# Patient Record
Sex: Male | Born: 1990 | Race: White | Hispanic: No | Marital: Single | Smoking: Former smoker
Health system: Southern US, Community
[De-identification: ages and names within clinical notes are randomized; demographics above are authoritative.]

## PROBLEM LIST (undated history)

## (undated) DIAGNOSIS — F329 Major depressive disorder, single episode, unspecified: Secondary | ICD-10-CM

## (undated) DIAGNOSIS — F1121 Opioid dependence, in remission: Secondary | ICD-10-CM

## (undated) DIAGNOSIS — I10 Essential (primary) hypertension: Secondary | ICD-10-CM

## (undated) DIAGNOSIS — F419 Anxiety disorder, unspecified: Secondary | ICD-10-CM

## (undated) DIAGNOSIS — R569 Unspecified convulsions: Secondary | ICD-10-CM

## (undated) DIAGNOSIS — F32A Depression, unspecified: Secondary | ICD-10-CM

## (undated) HISTORY — PX: APPENDECTOMY: SHX54

---
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
## 2010-12-31 ENCOUNTER — Encounter (HOSPITAL_COMMUNITY): Payer: Self-pay

## 2010-12-31 ENCOUNTER — Other Ambulatory Visit (HOSPITAL_COMMUNITY): Payer: Self-pay | Admitting: Internal Medicine

## 2010-12-31 ENCOUNTER — Ambulatory Visit (HOSPITAL_COMMUNITY)
Admission: RE | Admit: 2010-12-31 | Discharge: 2010-12-31 | Disposition: A | Discharge status: Home / Self Care | Payer: Self-pay | Source: Ambulatory Visit | Attending: Internal Medicine | Admitting: Internal Medicine

## 2010-12-31 DIAGNOSIS — M79641 Pain in right hand: Secondary | ICD-10-CM

## 2010-12-31 DIAGNOSIS — M79609 Pain in unspecified limb: Secondary | ICD-10-CM | POA: Insufficient documentation

## 2011-02-18 NOTE — Op Note (Signed)
Javier Johnston, Javier Johnston             ACCOUNT NO.:  [id number]   MEDICAL RECORD NO.:  [id number]          PATIENT TYPE:  INP   LOCATION:  A304                          FACILITY:  [facility]   PHYSICIAN:  Dalia Heading, M.D.  DATE OF BIRTH:  1991-04-26   DATE OF PROCEDURE:  06/24/2007  DATE OF DISCHARGE:                               OPERATIVE REPORT   PREOPERATIVE DIAGNOSIS:  Acute appendicitis.   POSTOPERATIVE DIAGNOSIS:  Acute appendicitis.   PROCEDURE:  Laparoscopic appendectomy.   SURGEON:  Dr. Franky Macho.   ANESTHESIA:  General endotracheal.   INDICATIONS:  The patient is a 20 year old white male who presents with  acute onset of right lower quadrant abdominal pain.  He was found on CT  scan of the abdomen to have acute appendicitis.  Risks and benefits of  the procedure including bleeding, infection, and the possibility of an  open procedure were fully explained to the patient's parents, gave  informed consent for the patient as the patient is a minor.   PROCEDURE NOTE:  The patient was placed in the Trendelenburg position  after the abdomen was prepped and draped in the usual sterile technique  with Betadine.  Surgical site confirmation was performed.   A supraumbilical incision was made down to the fascia.  A Veress needle  was introduced into the abdominal cavity and confirmation of placement  was done using the saline drop test.  The abdomen was then insufflated  to 16 mmHg pressure.  An 11 mm trocar was introduced into the abdominal  cavity under direct visualization without difficulty.  An additional 12-  mm trocar was placed in suprapubic region and a 5-mm trocar was placed  left lower quadrant region.  The appendix was visualized and noted to be  diffusely inflamed.  The mesoappendix was divided using the harmonic  scalpel.  Vascular Endo-GIA was placed across the base of the appendix  and fired.  The appendix was removed using EndoCatch bag.  The staple  line was  inspected and noted to be within normal limits.  All fluid and  then evacuate from the abdominal cavity prior to removal of the trocars.   All wounds were irrigated normal saline.  All wounds were injected with  0.5 cm Sensorcaine.  The supraumbilical fascia as well as suprapubic  fascia reapproximated using 0 Vicryl interrupted sutures.  All skin  incisions were closed using staples.  Betadine ointment, dry sterile  dressings were applied.   All tape and needle counts correct the end of the procedure.  The  patient was extubated in the operating room and went back to recovery  room awake in stable condition.   COMPLICATIONS:  None.   SPECIMEN:  Appendix.   BLOOD LOSS:  Minimal.      Dalia Heading, M.D.  Electronically Signed     MAJ/MEDQ  D:  06/24/2007  T:  06/25/2007  Job:  [id number]

## 2011-07-02 ENCOUNTER — Emergency Department (HOSPITAL_COMMUNITY): Payer: Self-pay

## 2011-07-02 ENCOUNTER — Encounter (HOSPITAL_COMMUNITY): Payer: Self-pay | Admitting: Emergency Medicine

## 2011-07-02 ENCOUNTER — Emergency Department (HOSPITAL_COMMUNITY)
Admission: EM | Admit: 2011-07-02 | Discharge: 2011-07-02 | Disposition: A | Discharge status: Home / Self Care | Payer: Self-pay | Attending: Emergency Medicine | Admitting: Emergency Medicine

## 2011-07-02 DIAGNOSIS — M7989 Other specified soft tissue disorders: Secondary | ICD-10-CM | POA: Insufficient documentation

## 2011-07-02 DIAGNOSIS — F17200 Nicotine dependence, unspecified, uncomplicated: Secondary | ICD-10-CM | POA: Insufficient documentation

## 2011-07-02 DIAGNOSIS — X58XXXA Exposure to other specified factors, initial encounter: Secondary | ICD-10-CM | POA: Insufficient documentation

## 2011-07-02 DIAGNOSIS — IMO0002 Reserved for concepts with insufficient information to code with codable children: Secondary | ICD-10-CM | POA: Insufficient documentation

## 2011-07-02 DIAGNOSIS — S60229A Contusion of unspecified hand, initial encounter: Secondary | ICD-10-CM

## 2011-07-02 DIAGNOSIS — Z880 Allergy status to penicillin: Secondary | ICD-10-CM | POA: Insufficient documentation

## 2011-07-02 MED ORDER — HYDROCODONE-ACETAMINOPHEN 5-500 MG PO TABS: 1.0000 | ORAL_TABLET | Freq: Four times a day (QID) | ORAL | Status: AC | PRN

## 2011-07-02 NOTE — ED Notes (Signed)
Pt c/o bilateral hand pain after hurting his hands while working on a truck yesterday. Pt also c/o anxiety and depression over recent loss of great grandmother/grandmother and friend and mother was recently dx with breast cancer. Pt c/o trouble sleeping. Denies si/hi.
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
## 2011-07-17 LAB — LIPASE, BLOOD: Lipase: 12

## 2011-07-17 LAB — URINALYSIS, ROUTINE W REFLEX MICROSCOPIC
Ketones, ur: NEGATIVE
Leukocytes, UA: NEGATIVE
Nitrite: NEGATIVE
Protein, ur: NEGATIVE
Urobilinogen, UA: 0.2

## 2011-07-17 LAB — DIFFERENTIAL
Eosinophils Relative: 0
Lymphocytes Relative: 8 — ABNORMAL LOW
Lymphs Abs: 1.3
Neutro Abs: 14.1 — ABNORMAL HIGH

## 2011-07-17 LAB — COMPREHENSIVE METABOLIC PANEL
AST: 35
Albumin: 4.6
CO2: 25
Calcium: 9.2
Creatinine, Ser: 0.78

## 2011-07-17 LAB — CBC
MCHC: 34.5
MCV: 91.1
Platelets: 226

## 2011-09-19 ENCOUNTER — Emergency Department (HOSPITAL_COMMUNITY)
Admission: EM | Admit: 2011-09-19 | Discharge: 2011-09-19 | Disposition: A | Discharge status: Other Acute Inpatient Hospital | Payer: Self-pay | Attending: Emergency Medicine | Admitting: Emergency Medicine

## 2011-09-19 ENCOUNTER — Encounter (HOSPITAL_COMMUNITY): Payer: Self-pay

## 2011-09-19 DIAGNOSIS — R45851 Suicidal ideations: Secondary | ICD-10-CM | POA: Insufficient documentation

## 2011-09-19 DIAGNOSIS — F3289 Other specified depressive episodes: Secondary | ICD-10-CM | POA: Insufficient documentation

## 2011-09-19 DIAGNOSIS — F329 Major depressive disorder, single episode, unspecified: Secondary | ICD-10-CM | POA: Insufficient documentation

## 2011-09-19 HISTORY — DX: Anxiety disorder, unspecified: F41.9

## 2011-09-19 HISTORY — DX: Major depressive disorder, single episode, unspecified: F32.9

## 2011-09-19 HISTORY — DX: Depression, unspecified: F32.A

## 2011-09-19 LAB — BASIC METABOLIC PANEL WITH GFR
BUN: 9 mg/dL (ref 6–23)
CO2: 30 meq/L (ref 19–32)
Calcium: 9.8 mg/dL (ref 8.4–10.5)
Chloride: 101 meq/L (ref 96–112)
Creatinine, Ser: 0.84 mg/dL (ref 0.50–1.35)
GFR calc Af Amer: >90 mL/min
GFR calc non Af Amer: >90 mL/min
Glucose, Bld: 114 mg/dL — ABNORMAL HIGH (ref 70–99)
Potassium: 3.8 meq/L (ref 3.5–5.1)
Sodium: 138 meq/L (ref 135–145)

## 2011-09-19 LAB — SALICYLATE LEVEL: Salicylate Lvl: <2 mg/dL — ABNORMAL LOW (ref 2.8–20.0)

## 2011-09-19 LAB — DIFFERENTIAL
Basophils Relative: 0 % (ref 0–1)
Lymphocytes Relative: 14 % (ref 12–46)
Lymphs Abs: 1.6 10*3/uL (ref 0.7–4.0)
Monocytes Absolute: 1.1 10*3/uL — ABNORMAL HIGH (ref 0.1–1.0)
Monocytes Relative: 10 % (ref 3–12)
Neutro Abs: 8.2 10*3/uL — ABNORMAL HIGH (ref 1.7–7.7)

## 2011-09-19 LAB — CBC
HCT: 43.1 % (ref 39.0–52.0)
Hemoglobin: 14.6 g/dL (ref 13.0–17.0)
MCH: 31.7 pg (ref 26.0–34.0)
MCHC: 33.9 g/dL (ref 30.0–36.0)
MCV: 93.7 fL (ref 78.0–100.0)
Platelets: 219 K/uL (ref 150–400)
RBC: 4.6 MIL/uL (ref 4.22–5.81)
RDW: 12.4 % (ref 11.5–15.5)
WBC: 11.5 K/uL — ABNORMAL HIGH (ref 4.0–10.5)

## 2011-09-19 LAB — RAPID URINE DRUG SCREEN, HOSP PERFORMED
Amphetamines: NOT DETECTED
Barbiturates: NOT DETECTED
Benzodiazepines: POSITIVE — AB
Cocaine: NOT DETECTED
Opiates: POSITIVE — AB
Tetrahydrocannabinol: POSITIVE — AB

## 2011-09-19 LAB — ACETAMINOPHEN LEVEL: Acetaminophen (Tylenol), Serum: <15 ug/mL (ref 10–30)

## 2011-09-19 LAB — ETHANOL: Alcohol, Ethyl (B): <11 mg/dL (ref 0–11)

## 2011-09-19 MED ORDER — NICOTINE 14 MG/24HR TD PT24: 14.0000 mg | MEDICATED_PATCH | Freq: Once | TRANSDERMAL | Status: DC

## 2011-09-19 MED ORDER — IBUPROFEN 400 MG PO TABS: 600.0000 mg | ORAL_TABLET | Freq: Three times a day (TID) | ORAL | Status: DC | PRN

## 2011-09-19 MED ORDER — NICOTINE 21 MG/24HR TD PT24: 21.0000 mg | MEDICATED_PATCH | Freq: Every day | TRANSDERMAL | Status: DC

## 2011-09-19 MED ORDER — LORAZEPAM 1 MG PO TABS: 1.0000 mg | ORAL_TABLET | Freq: Three times a day (TID) | ORAL | Status: DC | PRN

## 2011-09-19 MED ORDER — NICOTINE 21 MG/24HR TD PT24
21.0000 mg | MEDICATED_PATCH | Freq: Once | TRANSDERMAL | Status: DC
  Administered 2011-09-19: 21 mg via TRANSDERMAL

## 2011-09-19 MED ORDER — NICOTINE 21 MG/24HR TD PT24
MEDICATED_PATCH | TRANSDERMAL | Status: AC
  Filled 2011-09-19: qty 1

## 2011-09-19 MED ORDER — IBUPROFEN 800 MG PO TABS
800.0000 mg | ORAL_TABLET | Freq: Once | ORAL | Status: AC
  Administered 2011-09-19: 800 mg via ORAL
  Filled 2011-09-19: qty 1

## 2011-09-19 MED ORDER — ZOLPIDEM TARTRATE 5 MG PO TABS: 5.0000 mg | ORAL_TABLET | Freq: Every evening | ORAL | Status: DC | PRN

## 2011-09-19 MED ORDER — LORAZEPAM 1 MG PO TABS
1.0000 mg | ORAL_TABLET | Freq: Once | ORAL | Status: AC
  Administered 2011-09-19: 1 mg via ORAL
  Filled 2011-09-19: qty 1

## 2011-09-19 MED ORDER — ACETAMINOPHEN 325 MG PO TABS: 650.0000 mg | ORAL_TABLET | ORAL | Status: DC | PRN
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
## 2011-09-19 NOTE — ED Notes (Signed)
Meal tray given.  Pt requesting something for hand pain.  edp notified and orders received.

## 2011-09-19 NOTE — ED Notes (Signed)
Parents asked to leave due to increased agitation of patient.  Parents escorted out.

## 2011-09-19 NOTE — ED Notes (Signed)
Pt getting agitated again while talking to family.  RN asked family to wait in waiting room.  Family left pt's room, but have not left dept, walking around in dept.  Security called to escort family to waiting room.  Pt's grandfather did verbalize to RN that pt could live with him and pt would be safe.  Pt agreed.  Informed family that decision would be made after [facility] team eval.  Pt and family verbalized understanding.  Sitter at bedside at this time.  nad noted.

## 2011-09-19 NOTE — ED Notes (Signed)
Pt unable to void at this time.  Given cup of ice water.

## 2011-09-19 NOTE — ED Notes (Signed)
Pt brought in by [employer] for IVC. Pt with IVC papers that father took out saying pt was kneeling at fathers bed with pills spread out around him. Pt denies hallucinations. Pt denies SI/HI.

## 2011-09-19 NOTE — ED Provider Notes (Signed)
Scribed for Donnetta Hutching, MD, the patient was seen in room [facility] . This chart was scribed by Ellie Lunch.   CSN: [id number] Arrival date & time: 09/19/2011  2:01 PM   First MD Initiated Contact with Patient 09/19/11 1510      Chief Complaint  Patient presents with  . Medical Clearance    (Consider location/radiation/quality/duration/timing/severity/associated sxs/prior treatment) HPI Javier Johnston is a 20 y.o. male w h/o depression and anxiety who presents to the Emergency Department for medical clearance. Per parents For the past 6 months, Pt has struggled with pain medication abuse and illegal drug usage. Pt has been in counseling for the past 3 months with pastor. Parents reports some improvement in the past month, but in the past week Pt has seemed to relapsed. Last night Father found Pt this morning in his Parents bed with his father's pain medication around him. Pt was disoriented and diaphoretic. Also reports Pt had collected guns around the house. Pt has expressed thoughts of self harm.   Pt's father has filled IVC papers.   Past Medical History  Diagnosis Date  . Anxiety   . Depression     Past Surgical History  Procedure Date  . Appendectomy     No family history on file. Pt lives with Parents  History  Substance Use Topics  . Smoking status: Current Everyday Smoker -- 0.5 packs/day  . Smokeless tobacco: Not on file  . Alcohol Use: No      Review of Systems 10 Systems reviewed and are negative for acute change except as noted in the HPI.   Allergies  Penicillins  Home Medications   Current Outpatient Rx  Name Route Sig Dispense Refill  . ASPIRIN-ACETAMINOPHEN-CAFFEINE 250-250-65 MG PO TABS Oral Take 1 tablet by mouth every 6 (six) hours as needed. migrains       BP 143/106  Pulse 114  Temp(Src) 98.1 F (36.7 C) (Oral)  Resp 20  SpO2 96%  Physical Exam  Nursing note and vitals reviewed. Constitutional: He is oriented to person, place,  and time. He appears well-developed and well-nourished.  HENT:  Head: Normocephalic and atraumatic.  Eyes: Conjunctivae and EOM are normal. Pupils are equal, round, and reactive to light.  Neck: Normal range of motion. Neck supple.  Cardiovascular: Normal rate and regular rhythm.   Pulmonary/Chest: Effort normal and breath sounds normal.  Abdominal: Soft. Bowel sounds are normal.  Musculoskeletal: Normal range of motion.  Neurological: He is alert and oriented to person, place, and time.  Skin: Skin is warm and dry.  Psychiatric:       Flight of ideas. Tangential thinking.     ED Course  Procedures (including critical care time) DIAGNOSTIC STUDIES: Oxygen Saturation is 96% on room air, normal by my interpretation.    COORDINATION OF CARE:   Labs Reviewed  CBC - Abnormal; Notable for the following:    WBC 11.5 (*)    All other components within normal limits  DIFFERENTIAL - Abnormal; Notable for the following:    Neutro Abs 8.2 (*)    Monocytes Absolute 1.1 (*)    All other components within normal limits  BASIC METABOLIC PANEL - Abnormal; Notable for the following:    Glucose, Bld 114 (*)    All other components within normal limits  URINE RAPID DRUG SCREEN (HOSP PERFORMED) - Abnormal; Notable for the following:    Opiates POSITIVE (*)    Benzodiazepines POSITIVE (*)    Tetrahydrocannabinol POSITIVE (*)  All other components within normal limits  SALICYLATE LEVEL - Abnormal; Notable for the following:    Salicylate Lvl <2.0 (*)    All other components within normal limits  ACETAMINOPHEN LEVEL   ED MEDICATIONS  Medications  nicotine (NICODERM CQ - dosed in mg/24 hours) patch 21 mg (21 mg Transdermal Patch Applied 09/19/11 1631)  LORazepam (ATIVAN) tablet 1 mg (1 mg Oral Given 09/19/11 1631)     No diagnosis found.    MDM  Patient has problem with opiates. Also expressed suicidal ideation. Father took out commitment papers. We'll consult behavioral health  I  personally performed the services described in this documentation, which was scribed in my presence. The recorded information has been reviewed and considered.      Donnetta Hutching, MD 09/19/11 [phone]

## 2011-09-19 NOTE — ED Notes (Signed)
Father returned to room with food for patient.  Father informed by nurse and  PD not to come back tonight.

## 2011-09-19 NOTE — ED Notes (Signed)
2 diamond looking earrings, 4 valves (earrings) placed in pt belongings bag with clothing, boots, and cell phone - locked in cabinet in EMS room.  Wallet with approx $100 given to security to lock in safe.

## 2011-09-19 NOTE — ED Notes (Signed)
Patient has been accepted to [facility].  Sheriff notified of need for transport for IVC.

## 2011-09-19 NOTE — ED Notes (Signed)
Patient left in custody of [facility].

## 2011-09-19 NOTE — ED Notes (Signed)
Patient requested that his wallet and cell phone be released to his parents.  Security notified and items released to mother.

## 2011-09-19 NOTE — ED Notes (Signed)
Pt's parents at bedside.  [employer] signed off on observation form.  Pt calm, cooperative.  nad noted.

## 2011-09-19 NOTE — ED Notes (Signed)
Felesa from [facility] in to speak with patient regarding need to be transferred to inpatient facility.

## 2011-09-19 NOTE — ED Notes (Signed)
Pt up to bathroom with sitter.  nad noted. 

## 2011-09-19 NOTE — ED Notes (Signed)
Pt became agitated, tearful stating "my nerves are getting to me."  Pt upset stating he does not need to go to rehab.  Process explained to pt.  Pt arguing with father.  Informed pt and family not to discuss current situation to prevent pt from getting upset.  Informed family that if pt continued to be upset that they would be asked to wait in waiting room.  Pt and family verbalized understanding.  Pt requesting nicotine patch at this time.  edp notified.

## 2011-11-02 ENCOUNTER — Other Ambulatory Visit: Payer: Self-pay

## 2011-11-02 ENCOUNTER — Emergency Department (HOSPITAL_COMMUNITY)
Admission: EM | Admit: 2011-11-02 | Discharge: 2011-11-02 | Disposition: A | Discharge status: Home / Self Care | Payer: Self-pay | Attending: Emergency Medicine | Admitting: Emergency Medicine

## 2011-11-02 ENCOUNTER — Encounter (HOSPITAL_COMMUNITY): Payer: Self-pay

## 2011-11-02 DIAGNOSIS — F411 Generalized anxiety disorder: Secondary | ICD-10-CM | POA: Insufficient documentation

## 2011-11-02 DIAGNOSIS — R569 Unspecified convulsions: Secondary | ICD-10-CM | POA: Insufficient documentation

## 2011-11-02 DIAGNOSIS — Z7982 Long term (current) use of aspirin: Secondary | ICD-10-CM | POA: Insufficient documentation

## 2011-11-02 DIAGNOSIS — Z9889 Other specified postprocedural states: Secondary | ICD-10-CM | POA: Insufficient documentation

## 2011-11-02 DIAGNOSIS — F329 Major depressive disorder, single episode, unspecified: Secondary | ICD-10-CM | POA: Insufficient documentation

## 2011-11-02 DIAGNOSIS — F3289 Other specified depressive episodes: Secondary | ICD-10-CM | POA: Insufficient documentation

## 2011-11-02 DIAGNOSIS — F17200 Nicotine dependence, unspecified, uncomplicated: Secondary | ICD-10-CM | POA: Insufficient documentation

## 2011-11-02 DIAGNOSIS — F1210 Cannabis abuse, uncomplicated: Secondary | ICD-10-CM | POA: Insufficient documentation

## 2011-11-02 HISTORY — DX: Unspecified convulsions: R56.9

## 2011-11-02 LAB — DIFFERENTIAL
Basophils Relative: 0 % (ref 0–1)
Eosinophils Absolute: 0.1 10*3/uL (ref 0.0–0.7)
Eosinophils Relative: 1 % (ref 0–5)
Lymphocytes Relative: 8 % — ABNORMAL LOW (ref 12–46)
Lymphs Abs: 1.1 10*3/uL (ref 0.7–4.0)
Neutrophils Relative %: 86 % — ABNORMAL HIGH (ref 43–77)

## 2011-11-02 LAB — CBC
MCV: 92.9 fL (ref 78.0–100.0)
Platelets: 244 10*3/uL (ref 150–400)
RBC: 5.08 MIL/uL (ref 4.22–5.81)
RDW: 13 % (ref 11.5–15.5)
WBC: 14.6 10*3/uL — ABNORMAL HIGH (ref 4.0–10.5)

## 2011-11-02 LAB — RAPID URINE DRUG SCREEN, HOSP PERFORMED
Amphetamines: NOT DETECTED
Barbiturates: NOT DETECTED
Benzodiazepines: NOT DETECTED
Tetrahydrocannabinol: POSITIVE — AB

## 2011-11-02 LAB — BASIC METABOLIC PANEL
BUN: 6 mg/dL (ref 6–23)
Chloride: 100 mEq/L (ref 96–112)
Creatinine, Ser: 0.84 mg/dL (ref 0.50–1.35)
GFR calc Af Amer: >90 mL/min (ref >90)
Glucose, Bld: 94 mg/dL (ref 70–99)
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
## 2011-11-02 NOTE — ED Notes (Signed)
Mom states, " his pastor wants [place] to tell you not to give pump any more medication until we find out what is going on" pt reports being sent to [facility] in December for narcotic addiction. Pt recently started on Wellbutrin for depression. Pt reports no change in depressed state since starting Wellbutrin. Denies si/hi

## 2011-11-02 NOTE — ED Notes (Signed)
Reports received from [facility] and given to MD.

## 2011-11-02 NOTE — ED Provider Notes (Addendum)
History   This chart was scribed for Gerhard Munch, MD by Clarita Crane. The patient was seen in room APA01/APA01 and the patient's care was started at 12:17PM.   CSN: [id number]  Arrival date & time 11/02/11  1148   First MD Initiated Contact with Patient 11/02/11 1206      Chief Complaint  Patient presents with  . Seizures    (Consider location/radiation/quality/duration/timing/severity/associated sxs/prior treatment) HPI Javier Johnston is a 21 y.o. male who presents to the Emergency Department accompanied by family members to be evaluated following seizure which occurred this morning while in church. Patient's family members note patient contracted his upper extremities and fell backwards from a seated positon at onset of seizure. Family members note patient returned to baseline orientation approximately 10 minutes following termination of seizure activity. Patient reports he experienced nausea, vomiting and HA following termination of seizure. Denies neck pain, visual disturbances. Furthermore, patient indicates that he has been fatigued and unable to sleep well for the past month. Patient with h/o seizure with last one occuring 1 month ago. Patient notes having evaluation performed for seizures at that time and had an MRI performed.  PCP- Renard Matter  Past Medical History  Diagnosis Date  . Anxiety   . Depression   . Seizure     had 1 seizure approx 2 months ago.    Past Surgical History  Procedure Date  . Appendectomy     No family history on file.  History  Substance Use Topics  . Smoking status: Current Everyday Smoker -- 0.5 packs/day  . Smokeless tobacco: Not on file  . Alcohol Use: No      Review of Systems 10 Systems reviewed and are negative for acute change except as noted in the HPI.  Allergies  Penicillins  Home Medications   Current Outpatient Rx  Name Route Sig Dispense Refill  . ASPIRIN-ACETAMINOPHEN-CAFFEINE 250-250-65 MG PO TABS Oral Take 1  tablet by mouth every 6 (six) hours as needed. migraines    . BUPROPION HCL ER (XL) 150 MG PO TB24 Oral Take 150 mg by mouth daily.    Marland Kitchen GABAPENTIN 300 MG PO CAPS Oral Take 300 mg by mouth 3 (three) times daily.      BP 138/88  Pulse 105  Temp(Src) 97.7 F (36.5 C) (Oral)  Resp 14  Ht 5\' 6"  (1.676 m)  Wt 140 lb (63.504 kg)  BMI 22.60 kg/m2  SpO2 97%  Physical Exam  Nursing note and vitals reviewed. Constitutional: He is oriented to person, place, and time. He appears well-developed and well-nourished. No distress.  HENT:  Head: Normocephalic and atraumatic.  Eyes: EOM are normal. Pupils are equal, round, and reactive to light.       Fundoscopic exam normal.   Neck: Normal range of motion. Neck supple. No tracheal deviation present.  Cardiovascular: Normal rate and regular rhythm.  Exam reveals no gallop and no friction rub.   No murmur heard. Pulmonary/Chest: Effort normal. No respiratory distress. He has no wheezes. He has no rales.  Abdominal: Soft. He exhibits no distension.  Musculoskeletal: Normal range of motion. He exhibits no edema.       C-spine non-tender.   Neurological: He is alert and oriented to person, place, and time. No sensory deficit.  Skin: Skin is warm and dry.  Psychiatric: He has a normal mood and affect. His behavior is normal.    ED Course  Procedures (including critical care time)  DIAGNOSTIC STUDIES: Oxygen Saturation is 96%  on room air, normal by my interpretation.    COORDINATION OF CARE: 12:20PM- Patient and family members informed of current plan for treatment and evaluation and agree with plan. 3:03PM- Patient informed of current lab and imaging results. Informed of intent to d/c home. Patient agrees with plan set forth at this time.  Labs Reviewed  URINE RAPID DRUG SCREEN (HOSP PERFORMED) - Abnormal; Notable for the following:    Tetrahydrocannabinol POSITIVE (*)    All other components within normal limits  CBC - Abnormal; Notable for  the following:    WBC 14.6 (*)    All other components within normal limits  DIFFERENTIAL - Abnormal; Notable for the following:    Neutrophils Relative 86 (*)    Neutro Abs 12.5 (*)    Lymphocytes Relative 8 (*)    All other components within normal limits  BASIC METABOLIC PANEL - Abnormal; Notable for the following:    Potassium 3.3 (*)    All other components within normal limits  GLUCOSE, CAPILLARY  POCT CBG MONITORING   No results found.   No diagnosis found.   Date: 11/02/2011  Rate: 93  Rhythm: normal sinus rhythm  QRS Axis: normal  Intervals: normal  ST/T Wave abnormalities: normal  Conduction Disutrbances:Rsr'  Narrative Interpretation:   Old EKG Reviewed: none available Abnormal ecg   MDM  This young male presents following a witnessed seizure.  The patient has history of one similar event, one month ago.  Following that event she was evaluated with CT scan, no neurology followup.  Today following a postictal period the patient is back to his baseline, according to him and his family.  Patient's labs are consistent with prior seizure.  Outside hospital records were obtained, and reviewed.  Notable positive findings include a toxicology screen that was positive for barbiturates, benzodiazepines, opiates, THC.  Given the use of these substances, and the patient's new use of Wellbutrin, there are multiple prior pharmacologic possibilities for the patient's new lower seizure threshold.  A prolonged discussion was conducted patient regarding necessity to cease all illicit medications as well as stop Wellbutrin pending review by his primary care physician.  The patient was also advised to continue not driving, and he was provided followup referral to neurology.  I personally performed the services described in this documentation, which was scribed in my presence. The recorded information has been reviewed and considered.    Gerhard Munch, MD 11/02/11 1610  Gerhard Munch, MD 11/02/11 [phone]

## 2011-11-02 NOTE — ED Notes (Signed)
Consent obtained for medical information release from [facility] in [place] where last treated for last seizure

## 2011-12-09 ENCOUNTER — Emergency Department (HOSPITAL_COMMUNITY)
Admission: EM | Admit: 2011-12-09 | Discharge: 2011-12-09 | Disposition: A | Discharge status: Home Health | Payer: Self-pay | Attending: Emergency Medicine | Admitting: Emergency Medicine

## 2011-12-09 ENCOUNTER — Emergency Department (HOSPITAL_COMMUNITY): Payer: Self-pay

## 2011-12-09 ENCOUNTER — Encounter (HOSPITAL_COMMUNITY): Payer: Self-pay | Admitting: *Deleted

## 2011-12-09 DIAGNOSIS — M25449 Effusion, unspecified hand: Secondary | ICD-10-CM | POA: Insufficient documentation

## 2011-12-09 DIAGNOSIS — M79609 Pain in unspecified limb: Secondary | ICD-10-CM | POA: Insufficient documentation

## 2011-12-09 DIAGNOSIS — F341 Dysthymic disorder: Secondary | ICD-10-CM | POA: Insufficient documentation

## 2011-12-09 DIAGNOSIS — IMO0002 Reserved for concepts with insufficient information to code with codable children: Secondary | ICD-10-CM | POA: Insufficient documentation

## 2011-12-09 DIAGNOSIS — S60229A Contusion of unspecified hand, initial encounter: Secondary | ICD-10-CM | POA: Insufficient documentation

## 2011-12-09 DIAGNOSIS — W208XXA Other cause of strike by thrown, projected or falling object, initial encounter: Secondary | ICD-10-CM | POA: Insufficient documentation

## 2011-12-09 DIAGNOSIS — I10 Essential (primary) hypertension: Secondary | ICD-10-CM | POA: Insufficient documentation

## 2011-12-09 DIAGNOSIS — M7989 Other specified soft tissue disorders: Secondary | ICD-10-CM | POA: Insufficient documentation

## 2011-12-09 DIAGNOSIS — F17200 Nicotine dependence, unspecified, uncomplicated: Secondary | ICD-10-CM | POA: Insufficient documentation

## 2011-12-09 HISTORY — DX: Essential (primary) hypertension: I10

## 2011-12-09 MED ORDER — IBUPROFEN 800 MG PO TABS: 800.0000 mg | ORAL_TABLET | Freq: Three times a day (TID) | ORAL | Status: AC

## 2011-12-09 MED ORDER — HYDROCODONE-ACETAMINOPHEN 5-325 MG PO TABS
1.0000 | ORAL_TABLET | Freq: Once | ORAL | Status: AC
  Administered 2011-12-09: 1 via ORAL
  Filled 2011-12-09: qty 1

## 2011-12-09 MED ORDER — HYDROCODONE-ACETAMINOPHEN 5-325 MG PO TABS: ORAL_TABLET | ORAL | Status: AC

## 2011-12-09 NOTE — Discharge Instructions (Signed)
Contusion  A contusion is a deep bruise. Contusions happen when an injury causes bleeding under the skin. Signs of bruising include pain, puffiness (swelling), and discolored skin. The contusion may turn blue, purple, or yellow.  HOME CARE    Put ice on the injured area.   Put ice in a plastic bag.   Place a towel between your skin and the bag.   Leave the ice on for 15 to 20 minutes, 3 to 4 times a day.   Only take medicine as told by your doctor.   Rest the injured area.   If possible, raise (elevate) the injured area to lessen puffiness.  GET HELP RIGHT AWAY IF:    You have more bruising or puffiness.   You have pain that is getting worse.   Your puffiness or pain is not helped by medicine.  MAKE SURE YOU:    Understand these instructions.   Will watch your condition.   Will get help right away if you are not doing well or get worse.  Document Released: 03/10/2008 Document Revised: 09/11/2011 Document Reviewed: 07/28/2011  [employer] Patient Information 2012 [employer].

## 2011-12-09 NOTE — ED Provider Notes (Signed)
History     CSN: [id number]  Arrival date & time 12/09/11  1051   First MD Initiated Contact with Patient 12/09/11 1101      Chief Complaint  Patient presents with  . Hand Injury    (Consider location/radiation/quality/duration/timing/severity/associated sxs/prior treatment) HPI Comments: Patient complains of pain to his right hand after a piece of wood fell on his hand. He reports pain to his fourth and fifth metacarpals. He also states he's had a previous injury to the same area. Pain is worse with palpation or movement. He states pain radiates into his wrist. He denies other injuries, numbness or weakness. Patient is right-hand dominant  Patient is a 21 y.o. male presenting with hand injury. The history is provided by the patient. No language interpreter was used.  Hand Injury  Incident onset: Several hours ago. The incident occurred at home. The injury mechanism was a direct blow. The pain is present in the right hand. The quality of the pain is described as aching and throbbing. The pain is moderate. The pain has been constant since the incident. Pertinent negatives include no fever and no malaise/fatigue. He reports no foreign bodies present. The symptoms are aggravated by movement, use and palpation. He has tried ice for the symptoms. The treatment provided mild relief.    Past Medical History  Diagnosis Date  . Anxiety   . Depression   . Seizure     had 1 seizure approx 2 months ago.  Marland Kitchen Hypertension     Past Surgical History  Procedure Date  . Appendectomy     History reviewed. No pertinent family history.  History  Substance Use Topics  . Smoking status: Current Everyday Smoker -- 0.5 packs/day  . Smokeless tobacco: Not on file  . Alcohol Use: No      Review of Systems  Constitutional: Negative for fever, chills and malaise/fatigue.  Gastrointestinal: Negative for nausea.  Genitourinary: Negative for dysuria, hematuria and flank pain.  Musculoskeletal:  Positive for joint swelling and arthralgias. Negative for myalgias and back pain.  Skin: Negative for rash.       Abrasion  Neurological: Negative for dizziness, weakness and numbness.  Hematological: Does not bruise/bleed easily.  All other systems reviewed and are negative.    Allergies  Penicillins  Home Medications   Current Outpatient Rx  Name Route Sig Dispense Refill  . AMLODIPINE BESYLATE 5 MG PO TABS Oral Take 5 mg by mouth daily.    . BUSPIRONE HCL 10 MG PO TABS Oral Take 10 mg by mouth 2 (two) times daily.    Marland Kitchen GABAPENTIN 300 MG PO CAPS Oral Take 300 mg by mouth 3 (three) times daily.    Jeananne Rama SULFATE 0.05-0.25 % OP SOLN Both Eyes Place 2 drops into both eyes 3 (three) times daily as needed. Running Eyes      BP 143/79  Pulse 117  Temp(Src) 98.6 F (37 C) (Oral)  Resp 20  Ht 5\' 6"  (1.676 m)  Wt 160 lb (72.576 kg)  BMI 25.82 kg/m2  SpO2 100%  Physical Exam  Nursing note and vitals reviewed. Constitutional: He is oriented to person, place, and time. He appears well-developed and well-nourished. No distress.  HENT:  Head: Normocephalic and atraumatic.  Cardiovascular: Normal rate, regular rhythm and normal heart sounds.   Pulmonary/Chest: Effort normal and breath sounds normal. No respiratory distress.  Musculoskeletal: Normal range of motion. He exhibits edema and tenderness.       Right hand: He exhibits  tenderness, bony tenderness and swelling. He exhibits normal range of motion, normal two-point discrimination, normal capillary refill and no deformity. normal sensation noted. Normal strength noted.       Hands:      Tenderness to palpation of the right fourth and fifth metacarpals. Mild to moderate soft tissue swelling. Small abrasion also present to the area. Radial pulse is strong, distal sensation is intact. Capillary refill is less than 2 seconds.  Neurological: He is alert and oriented to person, place, and time. He exhibits normal muscle  tone. Coordination normal.  Skin: Skin is warm and dry.    ED Course  Procedures (including critical care time)  Labs Reviewed - No data to display Dg Hand Complete Right  12/09/2011  *RADIOLOGY REPORT*  Clinical Data: 21 year old male with pain and swelling of the fourth MCP following injury.  RIGHT HAND - COMPLETE 3+ VIEW  Comparison: 07/02/2011.  Findings: Bone mineralization is within normal limits.  Distal radius and ulna appear intact.  Carpal bone alignment within normal limits.  Metacarpals appear intact.  MCP joints are within normal limits.  There is dorsal soft tissue swelling noted. No radiopaque foreign body identified.  No subcutaneous gas.  No fracture or bony erosion identified.  IMPRESSION: Soft tissue swelling without acute osseous abnormality identified in the right hand.  Original Report Authenticated By: Harley Hallmark, M.D.    Abrasion was bandaged, bulky dressing applied by nursing staff. Pain was improved. He remains neurovascularly intact.    MDM   Patient has tenderness to palpation of the right fourth and fifth metacarpals.  Pain is reproduced with movement and palpation. Distal sensation is intact, radial pulse is brisk, cap refill is less than 2 seconds. Mild to moderate soft tissue swelling over the base of the fourth and fifth metacarpals Without other deformities.   Patient agrees to close followup with Dr. Romeo Apple.  Patient / Family / Caregiver understand and agree with initial ED impression and plan with expectations set for ED visit. Pt stable in ED with no significant deterioration in condition. Pt feels improved after observation and/or treatment in ED.        Georgiann Neider L. [place], [place] 12/11/11 1803

## 2011-12-09 NOTE — ED Notes (Signed)
Injury to rt hand  And wrist.  On wood splitter.

## 2011-12-09 NOTE — ED Notes (Signed)
Pt DC to home with steady gait 

## 2011-12-13 NOTE — ED Provider Notes (Signed)
Medical screening examination/treatment/procedure(s) were performed by non-physician practitioner and as supervising physician I was immediately available for consultation/collaboration.  Nicoletta Dress. Colon Branch, MD 12/13/11 2149

## 2012-05-18 ENCOUNTER — Inpatient Hospital Stay (HOSPITAL_COMMUNITY): Payer: BC Managed Care – PPO

## 2012-05-18 ENCOUNTER — Inpatient Hospital Stay (HOSPITAL_COMMUNITY)
Admission: EM | Admit: 2012-05-18 | Discharge: 2012-05-19 | DRG: 321 | Disposition: A | Discharge status: Home Health | Payer: BC Managed Care – PPO | Attending: Internal Medicine | Admitting: Internal Medicine

## 2012-05-18 ENCOUNTER — Encounter (HOSPITAL_COMMUNITY): Payer: Self-pay

## 2012-05-18 DIAGNOSIS — M545 Low back pain, unspecified: Secondary | ICD-10-CM | POA: Diagnosis present

## 2012-05-18 DIAGNOSIS — F1210 Cannabis abuse, uncomplicated: Secondary | ICD-10-CM | POA: Diagnosis present

## 2012-05-18 DIAGNOSIS — F1121 Opioid dependence, in remission: Secondary | ICD-10-CM

## 2012-05-18 DIAGNOSIS — Z8249 Family history of ischemic heart disease and other diseases of the circulatory system: Secondary | ICD-10-CM

## 2012-05-18 DIAGNOSIS — F32A Depression, unspecified: Secondary | ICD-10-CM

## 2012-05-18 DIAGNOSIS — Z880 Allergy status to penicillin: Secondary | ICD-10-CM

## 2012-05-18 DIAGNOSIS — R7309 Other abnormal glucose: Secondary | ICD-10-CM | POA: Diagnosis present

## 2012-05-18 DIAGNOSIS — F1921 Other psychoactive substance dependence, in remission: Secondary | ICD-10-CM

## 2012-05-18 DIAGNOSIS — N179 Acute kidney failure, unspecified: Secondary | ICD-10-CM

## 2012-05-18 DIAGNOSIS — E860 Dehydration: Secondary | ICD-10-CM

## 2012-05-18 DIAGNOSIS — N12 Tubulo-interstitial nephritis, not specified as acute or chronic: Principal | ICD-10-CM

## 2012-05-18 DIAGNOSIS — F1910 Other psychoactive substance abuse, uncomplicated: Secondary | ICD-10-CM | POA: Diagnosis present

## 2012-05-18 DIAGNOSIS — F341 Dysthymic disorder: Secondary | ICD-10-CM

## 2012-05-18 DIAGNOSIS — R739 Hyperglycemia, unspecified: Secondary | ICD-10-CM | POA: Diagnosis present

## 2012-05-18 DIAGNOSIS — F329 Major depressive disorder, single episode, unspecified: Secondary | ICD-10-CM | POA: Diagnosis present

## 2012-05-18 DIAGNOSIS — F419 Anxiety disorder, unspecified: Secondary | ICD-10-CM | POA: Diagnosis present

## 2012-05-18 DIAGNOSIS — F411 Generalized anxiety disorder: Secondary | ICD-10-CM | POA: Diagnosis present

## 2012-05-18 DIAGNOSIS — R112 Nausea with vomiting, unspecified: Secondary | ICD-10-CM

## 2012-05-18 DIAGNOSIS — F3289 Other specified depressive episodes: Secondary | ICD-10-CM | POA: Diagnosis present

## 2012-05-18 DIAGNOSIS — F17200 Nicotine dependence, unspecified, uncomplicated: Secondary | ICD-10-CM | POA: Diagnosis present

## 2012-05-18 HISTORY — DX: Opioid dependence, in remission: F11.21

## 2012-05-18 LAB — BASIC METABOLIC PANEL
CO2: 24 mEq/L (ref 19–32)
Chloride: 101 mEq/L (ref 96–112)
Creatinine, Ser: 4.1 mg/dL — ABNORMAL HIGH (ref 0.50–1.35)
Potassium: 4.2 mEq/L (ref 3.5–5.1)

## 2012-05-18 LAB — HEPATIC FUNCTION PANEL
ALT: 15 U/L (ref 0–53)
AST: 30 U/L (ref 0–37)
Albumin: 4.9 g/dL (ref 3.5–5.2)
Alkaline Phosphatase: 93 U/L (ref 39–117)
Total Bilirubin: 0.5 mg/dL (ref 0.3–1.2)
Total Protein: 7.9 g/dL (ref 6.0–8.3)

## 2012-05-18 LAB — URINALYSIS, ROUTINE W REFLEX MICROSCOPIC
Bilirubin Urine: NEGATIVE
Glucose, UA: NEGATIVE mg/dL
Specific Gravity, Urine: 1.025 (ref 1.005–1.030)
pH: 5.5 (ref 5.0–8.0)

## 2012-05-18 LAB — URINE MICROSCOPIC-ADD ON

## 2012-05-18 LAB — RAPID URINE DRUG SCREEN, HOSP PERFORMED
Amphetamines: POSITIVE — AB
Barbiturates: NOT DETECTED
Benzodiazepines: NOT DETECTED
Cocaine: NOT DETECTED
Opiates: POSITIVE — AB
Tetrahydrocannabinol: POSITIVE — AB

## 2012-05-18 LAB — CBC WITH DIFFERENTIAL/PLATELET
Basophils Relative: 0 % (ref 0–1)
HCT: 50.2 % (ref 39.0–52.0)
Hemoglobin: 18.3 g/dL — ABNORMAL HIGH (ref 13.0–17.0)
Lymphocytes Relative: 8 % — ABNORMAL LOW (ref 12–46)
Lymphs Abs: 1.6 10*3/uL (ref 0.7–4.0)
MCHC: 36.5 g/dL — ABNORMAL HIGH (ref 30.0–36.0)
Monocytes Absolute: 1.1 10*3/uL — ABNORMAL HIGH (ref 0.1–1.0)
Monocytes Relative: 5 % (ref 3–12)
Neutro Abs: 17 10*3/uL — ABNORMAL HIGH (ref 1.7–7.7)
RBC: 5.77 MIL/uL (ref 4.22–5.81)

## 2012-05-18 LAB — CK: Total CK: 447 U/L — ABNORMAL HIGH (ref 7–232)

## 2012-05-18 MED ORDER — SODIUM CHLORIDE 0.9 % IV BOLUS (SEPSIS)
1000.0000 mL | INTRAVENOUS | Status: AC
  Administered 2012-05-18: 1000 mL via INTRAVENOUS

## 2012-05-18 MED ORDER — NICOTINE 21 MG/24HR TD PT24
MEDICATED_PATCH | TRANSDERMAL | Status: AC
  Administered 2012-05-18: 21 mg via TRANSDERMAL
  Filled 2012-05-18: qty 1

## 2012-05-18 MED ORDER — ONDANSETRON HCL 4 MG/2ML IJ SOLN
4.0000 mg | Freq: Once | INTRAMUSCULAR | Status: AC
  Administered 2012-05-18: 4 mg via INTRAVENOUS
  Filled 2012-05-18: qty 2

## 2012-05-18 MED ORDER — ACETAMINOPHEN 325 MG PO TABS: 650.0000 mg | ORAL_TABLET | ORAL | Status: DC | PRN

## 2012-05-18 MED ORDER — NICOTINE 21 MG/24HR TD PT24
21.0000 mg | MEDICATED_PATCH | Freq: Every day | TRANSDERMAL | Status: DC
  Administered 2012-05-19: 21 mg via TRANSDERMAL
  Filled 2012-05-18: qty 1

## 2012-05-18 MED ORDER — SODIUM CHLORIDE 0.9 % IV SOLN
INTRAVENOUS | Status: DC
  Administered 2012-05-18 – 2012-05-19 (×3): via INTRAVENOUS

## 2012-05-18 MED ORDER — BISACODYL 5 MG PO TBEC: 5.0000 mg | DELAYED_RELEASE_TABLET | Freq: Every day | ORAL | Status: DC | PRN

## 2012-05-18 MED ORDER — CHLORHEXIDINE GLUCONATE 0.12 % MT SOLN
15.0000 mL | Freq: Two times a day (BID) | OROMUCOSAL | Status: DC
  Administered 2012-05-18 – 2012-05-19 (×3): 15 mL via OROMUCOSAL
  Filled 2012-05-18 (×3): qty 15

## 2012-05-18 MED ORDER — TRAZODONE HCL 50 MG PO TABS: 50.0000 mg | ORAL_TABLET | Freq: Every evening | ORAL | Status: DC | PRN

## 2012-05-18 MED ORDER — SODIUM CHLORIDE 0.9 % IJ SOLN
3.0000 mL | Freq: Two times a day (BID) | INTRAMUSCULAR | Status: DC
  Administered 2012-05-18: 3 mL via INTRAVENOUS
  Filled 2012-05-18: qty 3

## 2012-05-18 MED ORDER — MORPHINE SULFATE 4 MG/ML IJ SOLN
2.0000 mg | Freq: Once | INTRAMUSCULAR | Status: AC
  Administered 2012-05-18: 2 mg via INTRAVENOUS
  Filled 2012-05-18: qty 1

## 2012-05-18 MED ORDER — ACETAMINOPHEN 650 MG RE SUPP: 650.0000 mg | Freq: Four times a day (QID) | RECTAL | Status: DC | PRN

## 2012-05-18 MED ORDER — CIPROFLOXACIN IN D5W 200 MG/100ML IV SOLN
200.0000 mg | Freq: Two times a day (BID) | INTRAVENOUS | Status: DC
  Administered 2012-05-18 (×2): 200 mg via INTRAVENOUS
  Filled 2012-05-18 (×4): qty 100

## 2012-05-18 MED ORDER — NICOTINE 21 MG/24HR TD PT24
21.0000 mg | MEDICATED_PATCH | Freq: Once | TRANSDERMAL | Status: AC
  Administered 2012-05-18: 21 mg via TRANSDERMAL

## 2012-05-18 MED ORDER — BIOTENE DRY MOUTH MT LIQD
15.0000 mL | Freq: Two times a day (BID) | OROMUCOSAL | Status: DC
  Administered 2012-05-18 (×2): 15 mL via OROMUCOSAL

## 2012-05-18 MED ORDER — ONDANSETRON HCL 4 MG/2ML IJ SOLN: 4.0000 mg | INTRAMUSCULAR | Status: DC | PRN

## 2012-05-18 MED ORDER — ENOXAPARIN SODIUM 30 MG/0.3ML ~~LOC~~ SOLN
30.0000 mg | SUBCUTANEOUS | Status: DC
  Administered 2012-05-18 – 2012-05-19 (×2): 30 mg via SUBCUTANEOUS
  Filled 2012-05-18 (×2): qty 0.3

## 2012-05-18 MED ORDER — SODIUM CHLORIDE 0.9 % IV SOLN: INTRAVENOUS | Status: DC

## 2012-05-18 MED ORDER — MAGNESIUM CITRATE PO SOLN: 1.0000 | Freq: Once | ORAL | Status: DC | PRN

## 2012-05-18 NOTE — ED Provider Notes (Signed)
History     CSN: [id number]  Arrival date & time 05/18/12  0207   First MD Initiated Contact with Patient 05/18/12 [phone]      Chief Complaint  Patient presents with  . Emesis  . Abdominal Cramping    (Consider location/radiation/quality/duration/timing/severity/associated sxs/prior treatment) HPI Javier Johnston is a 21 y.o. male who presents to the Emergency Department complaining of muscle cramping, nausea, vomiting x 2 days which began when he worked outdoors in the heat. He states he has had no urine output in the last 24 hours. Denies fever, chills.   PCP Dr. Regino Schultze Past Medical History  Diagnosis Date  . Anxiety   . Depression   . Seizure     had 1 seizure approx 2 months ago.  Marland Kitchen Hypertension     Past Surgical History  Procedure Date  . Appendectomy     No family history on file.  History  Substance Use Topics  . Smoking status: Current Everyday Smoker -- 0.5 packs/day  . Smokeless tobacco: Not on file  . Alcohol Use: No      Review of Systems  Constitutional: Negative for fever.       10 Systems reviewed and are negative for acute change except as noted in the HPI.  HENT: Negative for congestion.   Eyes: Negative for discharge and redness.  Respiratory: Negative for cough and shortness of breath.   Cardiovascular: Negative for chest pain.  Gastrointestinal: Positive for nausea and vomiting. Negative for abdominal pain.  Musculoskeletal: Negative for back pain.       Muscle cramping  Skin: Negative for rash.  Neurological: Negative for syncope, numbness and headaches.  Psychiatric/Behavioral:       No behavior change.    Allergies  Penicillins  Home Medications   Current Outpatient Rx  Name Route Sig Dispense Refill  . AMLODIPINE BESYLATE 5 MG PO TABS Oral Take 5 mg by mouth daily.    . BUSPIRONE HCL 10 MG PO TABS Oral Take 10 mg by mouth 2 (two) times daily.    Marland Kitchen GABAPENTIN 300 MG PO CAPS Oral Take 300 mg by mouth 3 (three) times daily.     Jeananne Rama SULFATE 0.05-0.25 % OP SOLN Both Eyes Place 2 drops into both eyes 3 (three) times daily as needed. Running Eyes      BP 140/95  Pulse 93  Temp 98.2 F (36.8 C) (Axillary)  Resp 22  Ht 5\' 6"  (1.676 m)  Wt 150 lb (68.04 kg)  BMI 24.21 kg/m2  SpO2 99%  Physical Exam  Nursing note and vitals reviewed. Constitutional: He appears well-developed.       Awake, alert, nontoxic appearance.  HENT:  Head: Atraumatic.  Eyes: Right eye exhibits no discharge. Left eye exhibits no discharge.  Neck: Neck supple.  Cardiovascular:       tachycardia  Pulmonary/Chest: Effort normal and breath sounds normal. He exhibits no tenderness.  Abdominal: Soft. Bowel sounds are normal. There is no tenderness. There is no rebound.  Musculoskeletal: He exhibits no tenderness.       Baseline ROM, no obvious new focal weakness.  Neurological:       Mental status and motor strength appears baseline for patient and situation.  Skin: No rash noted.  Psychiatric: He has a normal mood and affect.    ED Course  Procedures (including critical care time) Results for orders placed during the hospital encounter of 05/18/12  BASIC METABOLIC PANEL      Component  Value Range   Sodium 142  135 - 145 mEq/L   Potassium 4.2  3.5 - 5.1 mEq/L   Chloride 101  96 - 112 mEq/L   CO2 24  19 - 32 mEq/L   Glucose, Bld 159 (*) 70 - 99 mg/dL   BUN 32 (*) 6 - 23 mg/dL   Creatinine, Ser 1.61 (*) 0.50 - 1.35 mg/dL   Calcium 09.6  8.4 - 04.5 mg/dL   GFR calc non Af Amer 19 (*) >90 mL/min   GFR calc Af Amer 22 (*) >90 mL/min  CK      Component Value Range   Total CK 447 (*) 7 - 232 U/L  CBC WITH DIFFERENTIAL      Component Value Range   WBC 19.8 (*) 4.0 - 10.5 K/uL   RBC 5.77  4.22 - 5.81 MIL/uL   Hemoglobin 18.3 (*) 13.0 - 17.0 g/dL   HCT 40.9  81.1 - 91.4 %   MCV 87.0  78.0 - 100.0 fL   MCH 31.7  26.0 - 34.0 pg   MCHC 36.5 (*) 30.0 - 36.0 g/dL   RDW 78.2  95.6 - 21.3 %   Platelets 228  150 - 400  K/uL   Neutrophils Relative 86 (*) 43 - 77 %   Neutro Abs 17.0 (*) 1.7 - 7.7 K/uL   Lymphocytes Relative 8 (*) 12 - 46 %   Lymphs Abs 1.6  0.7 - 4.0 K/uL   Monocytes Relative 5  3 - 12 %   Monocytes Absolute 1.1 (*) 0.1 - 1.0 K/uL   Eosinophils Relative 0  0 - 5 %   Eosinophils Absolute 0.1  0.0 - 0.7 K/uL   Basophils Relative 0  0 - 1 %   Basophils Absolute 0.0  0.0 - 0.1 K/uL    No results found.   No diagnosis found.  6:08 AM:  T/C to Dr. Orvan Falconer, hospitalist, case discussed, including:  HPI, pertinent PM/SHx, VS/PE, dx testing, ED course and treatment.  Agreeable to admission.  Requests to write temporary orders, telemetry bed.   MDM  Patient with dehydration, renal failure, elevated total CK. Received 3 liters IVF without urine output. Will arrange admission.Spoke with Dr. Orvan Falconer who will admit patient.Pt stable in ED with no significant deterioration in condition.The patient appears reasonably stabilized for admission considering the current resources, flow, and capabilities available in the ED at this time, and I doubt any other [facility] requiring further screening and/or treatment in the ED prior to admission.  MDM Reviewed: nursing note and vitals Interpretation: labs           Nicoletta Dress. Colon Branch, MD 05/19/12 [phone]

## 2012-05-18 NOTE — H&P (Signed)
[facility] History and Physical  KOHL POLINSKY ZOX:[id number] DOB: 02/19/1991 DOA:  05/18/2012    PCP:   Kirk Ruths, MD   Chief Complaint:  Vomiting and low back pain since yesterday afternoon  HPI: Javier Johnston is an 21 y.o. male.  Young Caucasian gentleman, works as an Warden/ranger, history of narcotic dependence (Percocet) treated at inpatient rehabilitation December 2012 through January 2013, denies any type of narcotic use since then, when confronted alleges first and only marijuana use 1 month ago, presents with the above symptoms. Says he cannot count the amount of time sees vomited at least once an hour; no blood in the vomitus. In the emergency room he was found to be in acute renal failure creatinine greater than 4, and has been completed and uric while in the emergency room. He has so far received 3 L of IV normal saline.  Denies diarrhea, abdominal pain, sick contacts.  Reports a lot of stressors in his life, but says this is much better than it used to be; he is close to his church and his church pastor who is his means of emotional support.  He is very anxious and jittery, and his history varus somewhat with retelling. Marijuana use is variously l 4 days ago then one month ago, for the first time; then when confronted with oh positive urine drug screens, it becomes "episodically." He is a not passed any urine for drug testing so far. Smokes half a pack of cigarettes per day for the past 4-5 years. He is an only child, lives with parent's.   Rewiew of Systems:   All systems negative except as marked bold or noted in the HPI;  Constitutional: Negative for malaise, fever and chills. ;  Eyes: Negative for eye pain, redness and discharge. ;  ENMT: Negative for ear pain, hoarseness, nasal congestion, sinus pressure and sore throat. ;  Cardiovascular: Negative for chest pain, palpitations, diaphoresis, dyspnea and peripheral edema. ;  Respiratory:  Negative for cough, hemoptysis, wheezing and stridor. ;  Gastrointestinal: Negative for. diarrhea, constipation, abdominal pain, melena, blood in stool, hematemesis, jaundice and rectal bleeding. unusual weight loss..   Genitourinary: Negative for frequency, dysuria, incontinence,flank pain and hematuria; Musculoskeletal: Negative for back pain and neck pain. Negative for swelling and trauma.;  Skin: . Negative for pruritus, rash, abrasions, bruising and skin lesion.; ulcerations Neuro: Negative for headache, lightheadedness and neck stiffness. Negative for weakness, altered level of consciousness , altered mental status, extremity weakness, burning feet, involuntary movement, seizure and syncope.  Psych: negative for anxiety, depression, insomnia, tearfulness, panic attacks, hallucinations, paranoia, suicidal or homicidal ideation    Past Medical History  Diagnosis Date  . Anxiety   . Depression   . Seizure     had 1 seizure approx 2 months ago.  Marland Kitchen Hypertension   . Narcotic dependence, in remission     were treated ibnpatent Dec-Jan 2013    Past Surgical History  Procedure Date  . Appendectomy     16    Medications:  HOME MEDS: Prior to Admission medications   Medication Sig Start Date End Date Taking? Authorizing Provider  amLODipine (NORVASC) 5 MG tablet Take 5 mg by mouth daily.    Historical Provider, MD  busPIRone (BUSPAR) 10 MG tablet Take 10 mg by mouth 2 (two) times daily.    Historical Provider, MD  gabapentin (NEURONTIN) 300 MG capsule Take 300 mg by mouth 3 (three) times daily.    Historical Provider, MD  tetrahydrozoline-zinc (VISINE-AC) 0.05-0.25 % ophthalmic solution Place 2 drops into both eyes 3 (three) times daily as needed. Running Eyes    Historical Provider, MD     Allergies:  Allergies  Allergen Reactions  . Penicillins Other (See Comments)    Unknown Childhood reaction    Social History:   reports that he has been smoking.  He does not have any  smokeless tobacco history on file. He reports that he uses illicit drugs. He reports that he does not drink alcohol.  Family History: Family History  Problem Relation Age of Onset  . Coronary artery disease Father 76     Physical Exam: Filed Vitals:   05/18/12 0300 05/18/12 0330 05/18/12 0430 05/18/12 0500  BP:    125/86  Pulse: 122 95 93   Temp:      TempSrc:      Resp:      Height:      Weight:      SpO2: 100% 97% 99%    Blood pressure 125/86, pulse 93, temperature 98.2 F (36.8 C), temperature source Axillary, resp. rate 22, height 5\' 6"  (1.676 m), weight 68.04 kg (150 lb), SpO2 99.00%.  GEN:  Pleasant but extremely anxious and jittery young Caucasian gentleman sitting up in the stretcher; cooperative with exam PSYCH:  alert and oriented x4; HEENT: Mucous membranes pink, DRY, and anicteric; PERRLA; EOM intact; no cervical lymphadenopathy nor thyromegaly or carotid bruit; no JVD; Breasts:: Not examined CHEST WALL: No tenderness CHEST: Normal respiration, clear to auscultation bilaterally HEART: Tachycardic; no murmurs rubs or gallops BACK: No kyphosis or scoliosis; reports tenderness across the lower back including muscles and bones; no redness, no swelling. ABDOMEN: Scaphoid soft non-tender; no masses, no organomegaly, normal abdominal bowel sounds; no pannus; no intertriginous candida. Rectal Exam: Not done EXTREMITIES: No bone or joint deformity; ; no edema; no ulcerations. Genitalia: not examined PULSES: 2+ and symmetric SKIN: Normal hydration no rash or ulceration CNS: Cranial nerves 2-12 grossly intact no focal lateralizing neurologic deficit   Labs on Admission:  Basic Metabolic Panel:  Lab 05/18/12 1610  NA 142  K 4.2  CL 101  CO2 24  GLUCOSE 159*  BUN 32*  CREATININE 4.10*  CALCIUM 10.0  MG --  PHOS --   Liver Function Tests: No results found for this basename: AST:5,ALT:5,ALKPHOS:5,BILITOT:5,PROT:5,ALBUMIN:5 in the last 168 hours No results found  for this basename: LIPASE:5,AMYLASE:5 in the last 168 hours No results found for this basename: AMMONIA:5 in the last 168 hours CBC:  Lab 05/18/12 0545  WBC 19.8*  NEUTROABS 17.0*  HGB 18.3*  HCT 50.2  MCV 87.0  PLT 228   Cardiac Enzymes:  Lab 05/18/12 0350  CKTOTAL 447*  CKMB --  CKMBINDEX --  TROPONINI --   BNP: No components found with this basename: POCBNP:5 CBG: No results found for this basename: GLUCAP:5 in the last 168 hours  Results for orders placed during the hospital encounter of 05/18/12 (from the past 48 hour(s))  BASIC METABOLIC PANEL     Status: Abnormal   Collection Time   05/18/12  3:50 AM      Component Value Range Comment   Sodium 142  135 - 145 mEq/L    Potassium 4.2  3.5 - 5.1 mEq/L    Chloride 101  96 - 112 mEq/L    CO2 24  19 - 32 mEq/L    Glucose, Bld 159 (*) 70 - 99 mg/dL    BUN 32 (*) 6 - 23  mg/dL    Creatinine, Ser 1.61 (*) 0.50 - 1.35 mg/dL    Calcium 09.6  8.4 - 10.5 mg/dL    GFR calc non Af Amer 19 (*) >90 mL/min    GFR calc Af Amer 22 (*) >90 mL/min   CK     Status: Abnormal   Collection Time   05/18/12  3:50 AM      Component Value Range Comment   Total CK 447 (*) 7 - 232 U/L   CBC WITH DIFFERENTIAL     Status: Abnormal   Collection Time   05/18/12  5:45 AM      Component Value Range Comment   WBC 19.8 (*) 4.0 - 10.5 K/uL    RBC 5.77  4.22 - 5.81 MIL/uL    Hemoglobin 18.3 (*) 13.0 - 17.0 g/dL    HCT 04.5  40.9 - 81.1 %    MCV 87.0  78.0 - 100.0 fL    MCH 31.7  26.0 - 34.0 pg    MCHC 36.5 (*) 30.0 - 36.0 g/dL    RDW 91.4  78.2 - 95.6 %    Platelets 228  150 - 400 K/uL    Neutrophils Relative 86 (*) 43 - 77 %    Neutro Abs 17.0 (*) 1.7 - 7.7 K/uL    Lymphocytes Relative 8 (*) 12 - 46 %    Lymphs Abs 1.6  0.7 - 4.0 K/uL    Monocytes Relative 5  3 - 12 %    Monocytes Absolute 1.1 (*) 0.1 - 1.0 K/uL    Eosinophils Relative 0  0 - 5 %    Eosinophils Absolute 0.1  0.0 - 0.7 K/uL    Basophils Relative 0  0 - 1 %    Basophils  Absolute 0.0  0.0 - 0.1 K/uL      Radiological Exams on Admission: No results found.   Assessment/Plan Present on Admission:  .Acute renal failure  Due to severe dehydration, continue vigorous hydration; Foley catheter to monitor input and output; renal consult tomorrow if not improved  .Nausea and vomiting  No vomiting since being in the ER; likely resolved; possible gastritis; possible narcotic withdrawal syndrome.will give her a graduated diet and monitor  .Lower back pain  No clear explanation, possible back strain; will treat with Tylenol; lidocaine patch if necessary; Voltaren gel if necessary; avoid narcotics and benzodiazepines because of addiction potential.avoid NSAIDs because of acute renal failure  .Narcotic dependence, in remission  Needs confirmation by drug testing; will avoid all drugs of dependence in this gentleman; needs counseling as an outpatient  .Anxiety .Anxiety and depression  Will avoid benzodiazepines because of addiction potential; will give trazodone at bedtime to help sleep; he reports he stopped the buspirone because it didn't help him; he also discontinued Prozac . Marland KitchenHyperglycemia  Possible stress reaction; will check hemoglobin A1c.   Other plans as per orders.  Code Status:  FULL CODE    Mattilyn Crites Nocturnist [facility] Pager [phone]  05/18/2012, 7:32 AM

## 2012-05-18 NOTE — ED Notes (Signed)
Unable to pass urine at this time, Dr. Orvan Falconer at bedside.

## 2012-05-18 NOTE — Progress Notes (Signed)
Patient admitted after midnight.  Chart reviewed. Patient examined. Has been working 10 hour days for 7 days straight outside [employer] in the 90s).  Drenching sweats.  Bilateral flank pain/low back pain.  UA with many bacteria.  TNTC RBC, granular casts, nit neg., LE neg, but no mention of WBC.  UDS shows amphetamines, opiates, THC. Patient admits to using marijuana. Received morphine in the emergency room prior to the urinalysis. When asked about the positive amphetamines, denies any amphetamine or methamphetamine use. Does report having taken Sudafed for a head cold recently. Will discontinue telemetry. Start antibiotics for pyelonephritis and check a renal ultrasound.

## 2012-05-18 NOTE — ED Notes (Signed)
Pt c/o abd cramping with nausea and vomiting that started 2 days ago. Denies diarrhea

## 2012-05-18 NOTE — Progress Notes (Signed)
Utilization Review Complete  

## 2012-05-19 DIAGNOSIS — N12 Tubulo-interstitial nephritis, not specified as acute or chronic: Principal | ICD-10-CM

## 2012-05-19 DIAGNOSIS — R112 Nausea with vomiting, unspecified: Secondary | ICD-10-CM

## 2012-05-19 LAB — CBC
Platelets: 176 10*3/uL (ref 150–400)
RBC: 4.16 MIL/uL — ABNORMAL LOW (ref 4.22–5.81)
RDW: 12.6 % (ref 11.5–15.5)
WBC: 8.1 10*3/uL (ref 4.0–10.5)

## 2012-05-19 LAB — URINE CULTURE
Colony Count: NO GROWTH
Culture: NO GROWTH

## 2012-05-19 LAB — BASIC METABOLIC PANEL
Calcium: 9.2 mg/dL (ref 8.4–10.5)
Creatinine, Ser: 1.01 mg/dL (ref 0.50–1.35)
GFR calc Af Amer: >90 mL/min (ref >90)
GFR calc non Af Amer: >90 mL/min (ref >90)
Sodium: 141 mEq/L (ref 135–145)

## 2012-05-19 LAB — HEMOGLOBIN A1C
Hgb A1c MFr Bld: 5.1 % (ref <5.7)
Hgb A1c MFr Bld: 5 % (ref <5.7)

## 2012-05-19 MED ORDER — ENOXAPARIN SODIUM 40 MG/0.4ML ~~LOC~~ SOLN: 40.0000 mg | SUBCUTANEOUS | Status: DC

## 2012-05-19 MED ORDER — HYDROCODONE-ACETAMINOPHEN 5-325 MG PO TABS: 1.0000 | ORAL_TABLET | Freq: Three times a day (TID) | ORAL | Status: AC | PRN

## 2012-05-19 MED ORDER — CIPROFLOXACIN HCL 500 MG PO TABS: 500.0000 mg | ORAL_TABLET | Freq: Two times a day (BID) | ORAL | Status: AC

## 2012-05-19 MED ORDER — HYDROCODONE-ACETAMINOPHEN 5-325 MG PO TABS
1.0000 | ORAL_TABLET | Freq: Once | ORAL | Status: AC
  Administered 2012-05-19: 1 via ORAL
  Filled 2012-05-19: qty 1

## 2012-05-19 MED ORDER — ACETAMINOPHEN 325 MG PO TABS: 650.0000 mg | ORAL_TABLET | ORAL | Status: DC | PRN

## 2012-05-19 MED ORDER — CIPROFLOXACIN HCL 250 MG PO TABS
250.0000 mg | ORAL_TABLET | Freq: Two times a day (BID) | ORAL | Status: DC
  Administered 2012-05-19: 250 mg via ORAL
  Filled 2012-05-19: qty 1

## 2012-05-19 MED ORDER — PROMETHAZINE HCL 12.5 MG PO TABS: 12.5000 mg | ORAL_TABLET | Freq: Four times a day (QID) | ORAL | Status: DC | PRN

## 2012-05-19 NOTE — Progress Notes (Signed)
Discharge instructions given to pt with teach back given back to RN. Pt. Ambulated to car.

## 2012-05-19 NOTE — Progress Notes (Signed)
PHARMACIST - PHYSICIAN COMMUNICATION DR:   Lendell Caprice CONCERNING: Antibiotic IV to Oral Route Change Policy  RECOMMENDATION: This patient is receiving Cipro by the intravenous route.  Based on criteria approved by the Pharmacy and Therapeutics Committee, the antibiotic(s) is/are being converted to the equivalent oral dose form(s).   DESCRIPTION: These criteria include:  Patient being treated for a respiratory tract infection, urinary tract infection, or cellulitis  The patient is not neutropenic and does not exhibit a GI malabsorption state  The patient is eating (either orally or via tube) and/or has been taking other orally administered medications for a least 24 hours  The patient is improving clinically and has a Tmax < 100.5  If you have questions about this conversion, please contact the Pharmacy Department  [x]   [phone] )  [facility] []   [phone] )  [facility]  []   [phone] )  [facility] []   [phone] )  [facility]    Will also increase Lovenox to 40mg  sq daily for improved renal function.   Junita Push, PharmD, BCPS 05/19/2012@9 :13 AM

## 2012-05-19 NOTE — Discharge Summary (Signed)
Physician Discharge Summary  Patient ID: Javier Johnston MRN: [id number] DOB/AGE: 26-Apr-1991 21 y.o.  Admit date: 05/18/2012 Discharge date: 05/19/2012  Discharge Diagnoses:  Principal Problem:  *Acute renal failure Active Problems:  Pyelonephritis  Nausea and vomiting  Anxiety and depression  Hyperglycemia   Medication List  As of 05/19/2012  9:34 AM   STOP taking these medications         co-enzyme Q-10 30 MG capsule         TAKE these medications         acetaminophen 325 MG tablet   Commonly known as: TYLENOL   Take 2 tablets (650 mg total) by mouth every 4 (four) hours as needed (or Fever >/= 101).      ciprofloxacin 500 MG tablet   Commonly known as: CIPRO   Take 1 tablet (500 mg total) by mouth 2 (two) times daily.      HYDROcodone-acetaminophen 5-325 MG per tablet   Commonly known as: NORCO/VICODIN   Take 1 tablet by mouth every 8 (eight) hours as needed for pain.      promethazine 12.5 MG tablet   Commonly known as: PHENERGAN   Take 1 tablet (12.5 mg total) by mouth every 6 (six) hours as needed for nausea.      tetrahydrozoline-zinc 0.05-0.25 % ophthalmic solution   Commonly known as: VISINE-AC   Place 2 drops into both eyes 3 (three) times daily as needed. Running Eyes      vitamin B-12 100 MCG tablet   Commonly known as: CYANOCOBALAMIN   Take 100 mcg by mouth daily.            Discharge Orders    Future Orders Please Complete By Expires   Diet general      Increase activity slowly      Discharge instructions      Comments:   Drink plenty of fluids      Follow-up Information    Follow up with Kirk Ruths, MD in 1 month. (to recheck kidney function)    Contact information:   [address] [place] [address] [phone]          Disposition: 06-Home-Health Care Svc  Discharged Condition: stable  Consults:  none  Labs:   Sodium         142 141    Potassium         4.2 3.8    Chloride         101 110     CO2         24 24    Mean Plasma Glucose          100    BUN         32 14    Creatinine, Ser         4.10 1.01    Calcium         10.0 9.2    GFR calc non Af Amer         19 90 mL/min">90    GFR calc Af Amer         22  90 mL/min  The eGFR has been calculated using the CKD EPI equation. This calculation has not been validated in all clinical situations. eGFR's persistently <90 mL/min signify possible Chronic Kidney Disease.">9090 mL/min  The eGFR has been calculated using the CKD EPI equation. This calculation has not been validated in all clinical situations. eGFR's persistently <  90 mL/min signify possible Chronic Kidney Disease." border=0 src="file:///C:/PROGRAM%20FILES%20(X86)/EPICSYS/V7.8/EN-US/Images/IP_COMMENT_EXIST.gif" width=5 height=10    Glucose, Bld         159 101    Alkaline Phosphatase          93    Albumin          4.9    AST          30    ALT          15    Total Protein          7.9    Bilirubin, Direct          <0.1    Indirect Bilirubin          NOT CALCULATED    Total Bilirubin          0.5     CARDIAC PROFILE    Total CK         447      CBC    WBC         19.8 8.1    RBC         5.77 4.16    Hemoglobin         18.3 13.2     HCT         50.2 37.5    MCV         87.0 90.1    MCH         31.7 31.7    MCHC         36.5 35.2    RDW         12.5 12.6    Platelets         228 176     DIFFERENTIAL    Neutrophils Relative         86     Lymphocytes Relative         8     Monocytes Relative         5     Eosinophils Relative         0     Basophils Relative         0     Neutro Abs         17.0     Lymphs Abs         1.6     Monocytes Absolute         1.1     Eosinophils Absolute         0.1     Basophils Absolute         0.0      DIABETES    Hemoglobin A1C          =6.5% Diagnostic of Diabetes Mellitus (if abnormal result is confirmed) 5.7-6.4% Increased risk of developing Diabetes Mellitus References:Diagnosis and  Classification of Diabetes Mellitus,Diabetes Care,2011,34(Suppl 1):S62-S69 and Standards of Medical Care in .Marland KitchenMarland Kitchen"5.1 =6.5% Diagnostic of Diabetes Mellitus (if abnormal result is confirmed) 5.7-6.4% Increased risk of developing Diabetes Mellitus References:Diagnosis and Classification of Diabetes Mellitus,Diabetes Care,2011,34(Suppl 1):S62-S69 and Standards of Medical Care in ..." border=0 src="file:///C:/PROGRAM%20FILES%20(X86)/EPICSYS/V7.8/EN-US/Images/IP_COMMENT_[email or web address].gif" width=5 height=10    Glucose, Bld         159 101     URINALYSIS    Color, Urine          YELLOW    APPearance          CLEAR    Specific Gravity, Urine  1.025    pH          5.5    Glucose, UA          NEGATIVE    Bilirubin Urine          NEGATIVE    Ketones, ur          TRACE    Protein, ur          30    Urobilinogen, UA          0.2    Nitrite          NEGATIVE    Leukocytes, UA          NEGATIVE    Hgb urine dipstick          MODERATE    RBC / HPF          TOO NUMEROUS TO COUNT    Squamous Epithelial / LPF          FEW    Bacteria, UA          MANY    Casts          GRANULAR CAST     TOX, URINE    Amphetamines          POSITIVE    Barbiturates          NONE DETECTED     Benzodiazepines          NONE DETECTED    Opiates          POSITIVE    Cocaine          NONE DETECTED    Tetrahydrocannabinol          POSITIVE      Diagnostics:  US Renal  05/18/2012  *RADIOLOGY REPORT*  Clinical Data: Acute renal failure.  Urinary tract infection.  RENAL/URINARY TRACT ULTRASOUND COMPLETE  Comparison:  CT scan of the abdomen dated 06/25/2007  Findings:  Right Kidney:  Normal.  10.7 cm in length.  Left Kidney:  Normal.  9.9 cm in length.  Bladder:  Bladder appears normal.  Ureteral jets are not identified.  IMPRESSION:  Normal renal ultrasound.  No hydronephrosis or other abnormality.  Original Report Authenticated By: Gwynn Burly, M.D.    Procedures: none  Full Code   Hospital Course: See H&P for  complete admission details. The patient is a pleasant 21 year old white male who presented with back pain and decreased urine output for a day or 2. He also had drenching sweats. Intractable vomiting. 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 He tries to stay hydrated while working. He felt very weak and near syncopal. In the emergency room, he was afebrile and had normal vital signs. He appeared anxious and jittery. His mucous membranes were dry. He had bilateral CVA tenderness. His abdomen was soft and nontender. In the emergency room, he was noted to have a white blood cell count is 19,000. Hemoglobin was 18. Total CPK was 447. BUN 32. Creatinine 4.10. Glucose 159. Bicarbonate 24. He was unable to produce a urine specimen in the emergency room but after hydration did have a urinalysis which showed many bacteria. Too numerous to count red cells. He was admitted to the hospitalist service and started on vigorous IV hydration, Rocephin. Renal ultrasound showed no abnormalities. His nausea resolved. His pain is slightly improved. He has a history of polysubstance abuse. He admits to having smoked marijuana within the  past month. Urine drug screen was positive for amphetamines and opiates. Patient denied taking amphetamines or methamphetamine. He did report that he had taken Sudafed recently. Also denied opiate use, but he had received a dose of morphine in the emergency room. Patient's renal function returned to normal very quickly and creatinine today is 1. He is tolerating a solid diet and feels much better. He has not required much in the way of pain medication. He denies having taken a lot of NSAIDs recently and his acute renal failure is likely multifactorial: Dehydration, pyelonephritis, heat exposure with inadequate fluid intake. Will give him a prescription for 10 hydrocodone this and he reports that his father will keep it in his safe for him. Urine culture is currently pending.  Total time on the day of discharge greater than 30 minutes.  Discharge Exam:  Blood pressure 115/69, pulse 100, temperature 98.2 F (36.8 C), temperature source Oral, resp. rate 16, height 5\' 6"  (1.676 m), weight 58.3 kg (128 lb 8.5 oz), SpO2 99.00%.  Gen:  Alert. Oriented. Comfortable and appropriate. Lungs clear to auscultation bilaterally without wheeze rhonchi or rales Cardiovascular regular rate rhythm without murmurs gallops rubs Abdomen soft nontender nondistended Extremities no clubbing cyanosis or edema  Signed: Aneesh Faller L 05/19/2012, 9:34 AM

## 2012-05-19 NOTE — Progress Notes (Signed)
[facility] [address] [place] [address] Phone: [phone] Fax: [phone]  May 19, 2012  Patient: Javier Johnston  Date of Birth: [birth year]  Date of Visit: 05/18/2012    To Whom It May Concern:  Javier Johnston was seen and treated in our emergency department on 05/18/2012. Javier Johnston  may return to work on 05/24/12.  Sincerely,      Crista Curb, M.D.

## 2012-09-08 ENCOUNTER — Encounter (HOSPITAL_COMMUNITY): Payer: Self-pay | Admitting: *Deleted

## 2012-09-08 ENCOUNTER — Emergency Department (HOSPITAL_COMMUNITY)
Admission: EM | Admit: 2012-09-08 | Discharge: 2012-09-08 | Disposition: A | Discharge status: Home / Self Care | Payer: Self-pay | Attending: Emergency Medicine | Admitting: Emergency Medicine

## 2012-09-08 DIAGNOSIS — E876 Hypokalemia: Secondary | ICD-10-CM | POA: Insufficient documentation

## 2012-09-08 DIAGNOSIS — Z79899 Other long term (current) drug therapy: Secondary | ICD-10-CM | POA: Insufficient documentation

## 2012-09-08 DIAGNOSIS — R443 Hallucinations, unspecified: Secondary | ICD-10-CM | POA: Insufficient documentation

## 2012-09-08 DIAGNOSIS — F3289 Other specified depressive episodes: Secondary | ICD-10-CM | POA: Insufficient documentation

## 2012-09-08 DIAGNOSIS — F329 Major depressive disorder, single episode, unspecified: Secondary | ICD-10-CM | POA: Insufficient documentation

## 2012-09-08 DIAGNOSIS — I10 Essential (primary) hypertension: Secondary | ICD-10-CM | POA: Insufficient documentation

## 2012-09-08 DIAGNOSIS — F411 Generalized anxiety disorder: Secondary | ICD-10-CM | POA: Insufficient documentation

## 2012-09-08 DIAGNOSIS — F1121 Opioid dependence, in remission: Secondary | ICD-10-CM | POA: Insufficient documentation

## 2012-09-08 DIAGNOSIS — F17200 Nicotine dependence, unspecified, uncomplicated: Secondary | ICD-10-CM | POA: Insufficient documentation

## 2012-09-08 LAB — CBC WITH DIFFERENTIAL/PLATELET
Basophils Absolute: 0 10*3/uL (ref 0.0–0.1)
Eosinophils Absolute: 0.3 10*3/uL (ref 0.0–0.7)
Eosinophils Relative: 3 % (ref 0–5)
Lymphocytes Relative: 10 % — ABNORMAL LOW (ref 12–46)
Lymphs Abs: 1.3 10*3/uL (ref 0.7–4.0)
MCV: 87.2 fL (ref 78.0–100.0)
Neutrophils Relative %: 80 % — ABNORMAL HIGH (ref 43–77)
Platelets: 221 10*3/uL (ref 150–400)
RBC: 5.47 MIL/uL (ref 4.22–5.81)
RDW: 12.6 % (ref 11.5–15.5)
WBC: 13 10*3/uL — ABNORMAL HIGH (ref 4.0–10.5)

## 2012-09-08 LAB — BASIC METABOLIC PANEL
CO2: 24 mEq/L (ref 19–32)
Calcium: 9.2 mg/dL (ref 8.4–10.5)
Chloride: 108 mEq/L (ref 96–112)
Creatinine, Ser: 0.79 mg/dL (ref 0.50–1.35)
Glucose, Bld: 90 mg/dL (ref 70–99)

## 2012-09-08 LAB — COMPREHENSIVE METABOLIC PANEL
ALT: 13 U/L (ref 0–53)
AST: 19 U/L (ref 0–37)
Alkaline Phosphatase: 93 U/L (ref 39–117)
CO2: 26 mEq/L (ref 19–32)
Calcium: 10.1 mg/dL (ref 8.4–10.5)
Glucose, Bld: 130 mg/dL — ABNORMAL HIGH (ref 70–99)
Potassium: 2.5 mEq/L — CL (ref 3.5–5.1)
Sodium: 136 mEq/L (ref 135–145)
Total Protein: 7.8 g/dL (ref 6.0–8.3)

## 2012-09-08 LAB — RAPID URINE DRUG SCREEN, HOSP PERFORMED
Barbiturates: NOT DETECTED
Benzodiazepines: NOT DETECTED
Cocaine: NOT DETECTED
Tetrahydrocannabinol: POSITIVE — AB

## 2012-09-08 MED ORDER — ALUM & MAG HYDROXIDE-SIMETH 200-200-20 MG/5ML PO SUSP: 30.0000 mL | ORAL | Status: DC | PRN

## 2012-09-08 MED ORDER — ESCITALOPRAM OXALATE 10 MG PO TABS
10.0000 mg | ORAL_TABLET | Freq: Every day | ORAL | Status: DC
  Administered 2012-09-08: 10 mg via ORAL
  Filled 2012-09-08 (×2): qty 1

## 2012-09-08 MED ORDER — AMLODIPINE BESYLATE 5 MG PO TABS
5.0000 mg | ORAL_TABLET | Freq: Every day | ORAL | Status: DC
  Administered 2012-09-08: 5 mg via ORAL
  Filled 2012-09-08: qty 1

## 2012-09-08 MED ORDER — NICOTINE 21 MG/24HR TD PT24
21.0000 mg | MEDICATED_PATCH | Freq: Every day | TRANSDERMAL | Status: DC
  Administered 2012-09-08: 21 mg via TRANSDERMAL
  Filled 2012-09-08: qty 1

## 2012-09-08 MED ORDER — POTASSIUM CHLORIDE CRYS ER 20 MEQ PO TBCR
40.0000 meq | EXTENDED_RELEASE_TABLET | Freq: Once | ORAL | Status: AC
  Administered 2012-09-08: 40 meq via ORAL
  Filled 2012-09-08: qty 2

## 2012-09-08 MED ORDER — ACETAMINOPHEN 325 MG PO TABS: 650.0000 mg | ORAL_TABLET | ORAL | Status: DC | PRN

## 2012-09-08 MED ORDER — ONDANSETRON HCL 4 MG PO TABS: 4.0000 mg | ORAL_TABLET | Freq: Three times a day (TID) | ORAL | Status: DC | PRN

## 2012-09-08 MED ORDER — ZOLPIDEM TARTRATE 5 MG PO TABS: 10.0000 mg | ORAL_TABLET | Freq: Every evening | ORAL | Status: DC | PRN

## 2012-09-08 MED ORDER — POTASSIUM CHLORIDE 10 MEQ/100ML IV SOLN
10.0000 meq | INTRAVENOUS | Status: AC
  Administered 2012-09-08 (×4): 10 meq via INTRAVENOUS
  Filled 2012-09-08 (×4): qty 100

## 2012-09-08 MED ORDER — IBUPROFEN 400 MG PO TABS: 600.0000 mg | ORAL_TABLET | Freq: Three times a day (TID) | ORAL | Status: DC | PRN

## 2012-09-08 MED ORDER — SODIUM CHLORIDE 0.9 % IV SOLN
1000.0000 mL | Freq: Once | INTRAVENOUS | Status: AC
  Administered 2012-09-08: 1000 mL via INTRAVENOUS

## 2012-09-08 MED ORDER — LORAZEPAM 1 MG PO TABS
1.0000 mg | ORAL_TABLET | Freq: Three times a day (TID) | ORAL | Status: DC | PRN
  Administered 2012-09-08: 1 mg via ORAL
  Filled 2012-09-08: qty 1

## 2012-09-08 MED ORDER — SODIUM CHLORIDE 0.9 % IV SOLN
1000.0000 mL | INTRAVENOUS | Status: DC
  Administered 2012-09-08: 1000 mL via INTRAVENOUS

## 2012-09-08 NOTE — ED Notes (Signed)
Pt expressing hopelessness, denies SI/HI.  Pt states he thinks he was "just having a dream and thought it was real."  Pt alert and oriented x 4 at this time.

## 2012-09-08 NOTE — ED Provider Notes (Signed)
Pt seen by telepsych.  Stable for d/c.  Advised to stop wellbutrin Pt without psychosis/SI Stable for d/c   Joya Gaskins, MD 09/08/12 1642

## 2012-09-08 NOTE — ED Notes (Signed)
Pt brought in by parents for medical clearance.  Mother reports pt was seen by Dr. Regino Schultze on Monday and had meds changed.  Mother states pt has been home all night last night but pt reports was in [place] in jail.  Mother reports pt has been hearing voices and talking to people that are not there.  Pt calm, cooperative at this time.  edp at bedside.
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
## 2012-09-08 NOTE — ED Notes (Signed)
Patient was placed on cardiac monitor.

## 2012-09-08 NOTE — ED Notes (Signed)
Spoke with Steven at [facility]. Dr. Santiago has been assigned-she is currently on another consult and has 4 pending, but is aware.  

## 2012-09-08 NOTE — ED Notes (Signed)
CRITICAL VALUE ALERT  Critical value received:  Potassium 2.5  Date of notification:  09/08/12  Time of notification:  937  Critical value read back:yes  Nurse who received alert:  Lyn Records  MD notified (1st page): Iva Knapp,MD  Time of first page:  937  MD notified (2nd page):  Time of second page:  Responding MD:  Harriet Pho  Time MD responded:  937

## 2012-09-09 ENCOUNTER — Encounter (HOSPITAL_COMMUNITY): Payer: Self-pay | Admitting: Emergency Medicine

## 2012-09-09 ENCOUNTER — Emergency Department (HOSPITAL_COMMUNITY): Payer: Self-pay

## 2012-09-09 ENCOUNTER — Emergency Department (HOSPITAL_COMMUNITY)
Admission: EM | Admit: 2012-09-09 | Discharge: 2012-09-13 | Disposition: A | Discharge status: Home / Self Care | Payer: Self-pay | Attending: Emergency Medicine | Admitting: Emergency Medicine

## 2012-09-09 DIAGNOSIS — I10 Essential (primary) hypertension: Secondary | ICD-10-CM | POA: Insufficient documentation

## 2012-09-09 DIAGNOSIS — F09 Unspecified mental disorder due to known physiological condition: Secondary | ICD-10-CM

## 2012-09-09 DIAGNOSIS — Z8659 Personal history of other mental and behavioral disorders: Secondary | ICD-10-CM | POA: Insufficient documentation

## 2012-09-09 DIAGNOSIS — F411 Generalized anxiety disorder: Secondary | ICD-10-CM | POA: Insufficient documentation

## 2012-09-09 DIAGNOSIS — F329 Major depressive disorder, single episode, unspecified: Secondary | ICD-10-CM | POA: Insufficient documentation

## 2012-09-09 DIAGNOSIS — Z79899 Other long term (current) drug therapy: Secondary | ICD-10-CM | POA: Insufficient documentation

## 2012-09-09 DIAGNOSIS — F3289 Other specified depressive episodes: Secondary | ICD-10-CM | POA: Insufficient documentation

## 2012-09-09 DIAGNOSIS — F060 Psychotic disorder with hallucinations due to known physiological condition: Secondary | ICD-10-CM | POA: Insufficient documentation

## 2012-09-09 DIAGNOSIS — F17200 Nicotine dependence, unspecified, uncomplicated: Secondary | ICD-10-CM | POA: Insufficient documentation

## 2012-09-09 LAB — BASIC METABOLIC PANEL
BUN: 4 mg/dL — ABNORMAL LOW (ref 6–23)
CO2: 24 mEq/L (ref 19–32)
Calcium: 10.1 mg/dL (ref 8.4–10.5)
Chloride: 103 mEq/L (ref 96–112)
Creatinine, Ser: 0.82 mg/dL (ref 0.50–1.35)

## 2012-09-09 LAB — RAPID URINE DRUG SCREEN, HOSP PERFORMED
Amphetamines: NOT DETECTED
Barbiturates: NOT DETECTED
Benzodiazepines: POSITIVE — AB

## 2012-09-09 LAB — CBC
HCT: 44.6 % (ref 39.0–52.0)
MCH: 32.9 pg (ref 26.0–34.0)
MCV: 91.2 fL (ref 78.0–100.0)
Platelets: 199 10*3/uL (ref 150–400)
RBC: 4.89 MIL/uL (ref 4.22–5.81)
RDW: 12.8 % (ref 11.5–15.5)
WBC: 10 10*3/uL (ref 4.0–10.5)

## 2012-09-09 MED ORDER — ONDANSETRON HCL 4 MG PO TABS: 4.0000 mg | ORAL_TABLET | Freq: Three times a day (TID) | ORAL | Status: DC | PRN

## 2012-09-09 MED ORDER — NICOTINE 21 MG/24HR TD PT24
21.0000 mg | MEDICATED_PATCH | Freq: Once | TRANSDERMAL | Status: AC
  Administered 2012-09-09 – 2012-09-10 (×2): 21 mg via TRANSDERMAL
  Filled 2012-09-09: qty 1

## 2012-09-09 MED ORDER — LORAZEPAM 1 MG PO TABS: 2.0000 mg | ORAL_TABLET | ORAL | Status: DC | PRN

## 2012-09-09 MED ORDER — QUETIAPINE FUMARATE ER 300 MG PO TB24
300.0000 mg | ORAL_TABLET | Freq: Every day | ORAL | Status: DC
  Administered 2012-09-09 – 2012-09-12 (×4): 300 mg via ORAL
  Filled 2012-09-09 (×4): qty 1

## 2012-09-09 MED ORDER — ACETAMINOPHEN 325 MG PO TABS: 650.0000 mg | ORAL_TABLET | ORAL | Status: DC | PRN

## 2012-09-09 MED ORDER — IBUPROFEN 400 MG PO TABS: 600.0000 mg | ORAL_TABLET | Freq: Three times a day (TID) | ORAL | Status: DC | PRN
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
## 2012-09-09 NOTE — ED Notes (Signed)
Pt placed in paper scrubs, wanded by security, sitter at bedside and charge/AC notifed of pt.
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
## 2012-09-09 NOTE — ED Notes (Signed)
EDP in with pt at this time.  

## 2012-09-09 NOTE — ED Notes (Signed)
ACT consult in room at this time speaking with patient
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
## 2012-09-10 MED ORDER — NICOTINE 21 MG/24HR TD PT24
MEDICATED_PATCH | TRANSDERMAL | Status: AC
  Administered 2012-09-10: 21 mg via TRANSDERMAL
  Filled 2012-09-10: qty 1

## 2012-09-10 NOTE — ED Notes (Signed)
Counselor in room with pt.

## 2012-09-10 NOTE — BH Assessment (Addendum)
Assessment Note   Javier Johnston is an 21 y.o. male. Reassessment of pt under IVC and awaiting psych bed placement.  Pt has reportedly been asleep for most of today but woke easily when ACT arrived to assess.  Pt was alert, oriented and answered all questions appropriately.  Pt was aware of the events that led to his coming to [facility] twice this week.  Pt reports that he does understand that he was hallucinating prior to coming to [facility] the second time.  Pt reports that he has not had any auditory or visual hallucinations today.  Pt thinks that it may have still been the effects of wellbutrin that led to the psychosis.  Pt denies SI/HI.  Pt understands that he is under IVC papers and is awaiting psych placement.  Pt was sent home after first ED visit and then brought back in an acute psychotic state.  Pt has had 2 telepsych evals which recommend more thorough medical workup and then inpt psych treatment.   Axis I: Psychotic Disorder NOS Axis II: Deferred Axis III:  Past Medical History  Diagnosis Date  . Anxiety   . Depression   . Seizure     had 1 seizure approx 2 months ago.  Marland Kitchen Hypertension   . Narcotic dependence, in remission     were treated ibnpatent Dec-Jan 2013   Axis IV: economic problems Axis V: 41-50 serious symptoms  Past Medical History:  Past Medical History  Diagnosis Date  . Anxiety   . Depression   . Seizure     had 1 seizure approx 2 months ago.  Marland Kitchen Hypertension   . Narcotic dependence, in remission     were treated ibnpatent Dec-Jan 2013    Past Surgical History  Procedure Date  . Appendectomy     16    Family History:  Family History  Problem Relation Age of Onset  . Coronary artery disease Father 47    Social History:  reports that he has been smoking.  He does not have any smokeless tobacco history on file. He reports that he does not drink alcohol or use illicit drugs.  Additional Social History:  Alcohol / Drug Use Pain Medications: Pt  denies Prescriptions: PT denies Over the Counter: Pt denies History of alcohol / drug use?: Yes Negative Consequences of Use: Financial;Legal;Personal relationships Substance #1 Name of Substance 1: alcohol 1 - Age of First Use: 16 1 - Amount (size/oz): 1 beer 1 - Frequency: 1-2x month 1 - Duration: 2 years 1 - Last Use / Amount: 2 weeks ago Substance #2 Name of Substance 2: marijuana 2 - Age of First Use: 16 2 - Amount (size/oz): 1 joint 2 - Frequency: 1x week 2 - Duration: 5 years 2 - Last Use / Amount: 1 month ago  CIWA: CIWA-Ar BP: 123/65 mmHg Pulse Rate: 50  Headache, Fullness in Head: none present COWS: Clinical Opiate Withdrawal Scale (COWS) Resting Pulse Rate: Pulse Rate 81-100 Sweating: Subjective report of chills or flushing Restlessness: Frequent shifting or extraneous movements of legs/arms Pupil Size: Pupils possibly larger than normal for room light Bone or Joint Aches: Not present Runny Nose or Tearing: Not present GI Upset: No GI symptoms Tremor: No tremor Yawning: No yawning Anxiety or Irritability: Patient so irritable or anxious that participation in the assessment is difficult Gooseflesh Skin: Skin is smooth COWS Total Score: 10   Allergies:  Allergies  Allergen Reactions  . Penicillins Other (See Comments)    Unknown Childhood  reaction  . Wellbutrin (Bupropion)     Home Medications:  (Not in a hospital admission)  OB/GYN Status:  No LMP for male patient.  General Assessment Data Location of Assessment: [facility] ACT Assessment: Yes Living Arrangements: Parent Can pt return to current living arrangement?: Yes Admission Status: Involuntary Is patient capable of signing voluntary admission?: No Transfer from: Acute Hospital [facility]) Referral Source:  (Dr Vanetta Mulders)  Education Status Is patient currently in school?: No Contact person: Junius Roads [facility] [phone]  Risk to self Suicidal Ideation: No Suicidal  Intent: No Is patient at risk for suicide?: No Suicidal Plan?: No Access to Means: No What has been your use of drugs/alcohol within the last 12 months?: Pt denies current use.  UDS + marijuana, benzos Previous Attempts/Gestures: No How many times?: 0  Other Self Harm Risks: na Triggers for Past Attempts: None known Intentional Self Injurious Behavior: None Family Suicide History: No Recent stressful life event(s): Job Loss;Other (Comment) (being alone in life) Persecutory voices/beliefs?: No Depression: Yes Depression Symptoms: Despondent;Isolating;Fatigue;Guilt;Loss of interest in usual pleasures Substance abuse history and/or treatment for substance abuse?: Yes Suicide prevention information given to non-admitted patients: Not applicable  Risk to Others Homicidal Ideation: No Thoughts of Harm to Others: No Current Homicidal Intent: No Current Homicidal Plan: No Access to Homicidal Means: No History of harm to others?: No Assessment of Violence: None Noted Violent Behavior Description: none Does patient have access to weapons?: No Criminal Charges Pending?: No Does patient have a court date: No  Psychosis Hallucinations: None noted Delusions: None noted  Mental Status Report Appear/Hygiene: Other (Comment) (casual) Eye Contact: Good Motor Activity: Unremarkable Speech: Logical/coherent Level of Consciousness: Alert Mood: Other (Comment) (cooperative) Affect: Appropriate to circumstance Anxiety Level: None Thought Processes: Coherent;Relevant Judgement: Unimpaired Orientation: Person;Place;Time;Situation Obsessive Compulsive Thoughts/Behaviors: None  Cognitive Functioning Concentration: Normal Memory: Recent Intact;Remote Intact IQ: Average Insight: Fair Impulse Control: Fair Appetite: Fair Weight Loss: 5  Weight Gain: 0  Sleep: No Change Total Hours of Sleep: 8  Vegetative Symptoms: None  ADLScreening [facility] Assessment Services) Patient's cognitive  ability adequate to safely complete daily activities?: Yes Patient able to express need for assistance with ADLs?: Yes Independently performs ADLs?: Yes (appropriate for developmental age)  Abuse/Neglect [facility]) Physical Abuse: Denies Verbal Abuse: Denies Sexual Abuse: Denies  Prior Inpatient Therapy Prior Inpatient Therapy: Yes Prior Therapy Dates: 2012 [facility] Reason for Treatment: [facility]iate detox  Prior Outpatient Therapy Prior Outpatient Therapy: No  ADL Screening (condition at time of admission) Patient's cognitive ability adequate to safely complete daily activities?: Yes Patient able to express need for assistance with ADLs?: Yes Independently performs ADLs?: Yes (appropriate for developmental age) Weakness of Legs: None Weakness of Arms/Hands: None  Home Assistive Devices/Equipment Home Assistive Devices/Equipment: None  Therapy Consults (therapy consults require a physician order) PT Evaluation Needed: No OT Evalulation Needed: No SLP Evaluation Needed: No Abuse/Neglect Assessment (Assessment to be complete while patient is alone) Physical Abuse: Denies Verbal Abuse: Denies Sexual Abuse: Denies Exploitation of patient/patient's resources: Denies Self-Neglect: Denies Values / Beliefs Cultural Requests During Hospitalization: None Spiritual Requests During Hospitalization: None   Advance Directives (For Healthcare) Advance Directive: Patient does not have advance directive;Patient would not like information Pre-existing out of facility DNR order (yellow form or pink MOST form): No    Additional Information 1:1 In Past 12 Months?: No CIRT Risk: No Elopement Risk: No Does patient have medical clearance?: Yes     Disposition: Elijah Birk  at [facility] reports pt is accepted at [facility] but no [address] hall beds available. He suggested referrals to other programs. [facility] full but will consider pt for wait list.  [facility], [facility], [facility] all  full and will not take pts for wait list-will have to check back with them to see if beds available. Disposition Disposition of Patient: Inpatient treatment program Type of inpatient treatment program: Adult  On Site Evaluation by:   Reviewed with Physician:     Lorri Frederick 09/10/2012 9:13 PM

## 2012-09-10 NOTE — ED Notes (Signed)
Phoned [facility] intake to get an update on status. Intake stated that they are waiting for a bed to become available on [address]. Stated no discharges pending tonight.

## 2012-09-10 NOTE — ED Notes (Signed)
Per Britta Mccreedy at [facility], patient has been accepted to [facility], but there are no beds available at this time. Britta Mccreedy also reports that patient's potassium level must be at least 3.5 before they can take patient.

## 2012-09-10 NOTE — ED Notes (Signed)
K+ result faxed to [facility]. Awaiting placement.

## 2012-09-10 NOTE — ED Notes (Signed)
Patient was yelling out in his sleep. Patient has quited down is now asleep again.

## 2012-09-10 NOTE — ED Notes (Signed)
Family members left phone numbers  Cindy [phone] Home- [phone]

## 2012-09-10 NOTE — ED Notes (Signed)
Parent's phone numbers received to notify them of pt's transfer time. Arline Asp (Mom) [phone]. Mellody Dance (Dad) [phone].

## 2012-09-10 NOTE — ED Notes (Signed)
Pt sleeping at present. Has been asleep all morning per report. Pt K+ within normal range.

## 2012-09-10 NOTE — ED Notes (Signed)
Pt's parents called to check on status of transfer. Still awaiting bed opening at [facility]. Pt watching TV and up to bathroom.

## 2012-09-11 NOTE — Progress Notes (Signed)
Patient ID: MAMIE DIIORIO, male   DOB: [birth year], 21 y.o.   MRN: [id number]  Spoke with Dr. Bernette Mayers [facility] ED) in reference to [facility] not having Bed availability for Mr. Reppert at this time.  Also informed that before patient could come to [facility] as suggested by radiologist A repeat MRI with and without contrast, potentially with MR angiography should be done. It is vital that all patients are completely medically cleared before admission at [facility] related to not having the appropriate to perform medical complicated care without having to call EMS or having a patient transferred back to the ED.    Meriam Chojnowski FNP-BC

## 2012-09-11 NOTE — BH Assessment (Addendum)
Assessment Note   Javier Johnston is an 21 y.o. male. Reassessment on this date of pt holding at [facility] for inpt psych admit.  Pt was asleep when ACT arrived but woke easily and was pleasant and cooperative.  Pt denied that he has had any hallucinations today.  States his father came and visited him and that he was able to take a shower and eat all meals and that he feels pretty good right now.  Pt reports that he would rather not be admitted to the hospital and he thinks that all of the previous psychosis was due to the wellbutrin.  Pt recounted the events:  He has been taking lexapro for depression through his PCP.  He had appt on Monday and told the doctor he still felt depressed.  The dr prescribed him wellbutrin even though pt had tried that a year ago and had a siezure.  Pt began taking wellbutrin Monday and that was when all the problems happened.  Pt denies current SI/HI as well.  Pt oriented x 4.  Axis I: deferred Axis II: Deferred Axis III:  Past Medical History  Diagnosis Date  . Anxiety   . Depression   . Seizure     had 1 seizure approx 2 months ago.  Marland Kitchen Hypertension   . Narcotic dependence, in remission     were treated ibnpatent Dec-Jan 2013   Axis IV: economic problems Axis V: 51-60 moderate symptoms  Past Medical History:  Past Medical History  Diagnosis Date  . Anxiety   . Depression   . Seizure     had 1 seizure approx 2 months ago.  Marland Kitchen Hypertension   . Narcotic dependence, in remission     were treated ibnpatent Dec-Jan 2013    Past Surgical History  Procedure Date  . Appendectomy     16    Family History:  Family History  Problem Relation Age of Onset  . Coronary artery disease Father 66    Social History:  reports that he has been smoking.  He does not have any smokeless tobacco history on file. He reports that he does not drink alcohol or use illicit drugs.  Additional Social History:  Alcohol / Drug Use Pain Medications: Pt denies Prescriptions:  PT denies Over the Counter: Pt denies History of alcohol / drug use?: Yes Negative Consequences of Use: Financial;Legal;Personal relationships Substance #1 Name of Substance 1: alcohol 1 - Age of First Use: 16 1 - Amount (size/oz): 1 beer 1 - Frequency: 1-2x month 1 - Duration: 2 years 1 - Last Use / Amount: 2 weeks ago Substance #2 Name of Substance 2: marijuana 2 - Age of First Use: 16 2 - Amount (size/oz): 1 joint 2 - Frequency: 1x week 2 - Duration: 5 years 2 - Last Use / Amount: 1 month ago  CIWA: CIWA-Ar BP: 108/60 mmHg Pulse Rate: 61  Headache, Fullness in Head: none present COWS: Clinical Opiate Withdrawal Scale (COWS) Resting Pulse Rate: Pulse Rate 81-100 Sweating: Subjective report of chills or flushing Restlessness: Frequent shifting or extraneous movements of legs/arms Pupil Size: Pupils possibly larger than normal for room light Bone or Joint Aches: Not present Runny Nose or Tearing: Not present GI Upset: No GI symptoms Tremor: No tremor Yawning: No yawning Anxiety or Irritability: Patient so irritable or anxious that participation in the assessment is difficult Gooseflesh Skin: Skin is smooth COWS Total Score: 10   Allergies:  Allergies  Allergen Reactions  . Penicillins Other (See  Comments)    Unknown Childhood reaction  . Wellbutrin (Bupropion)     Home Medications:  (Not in a hospital admission)  OB/GYN Status:  No LMP for male patient.  General Assessment Data Location of Assessment: [facility] ACT Assessment: Yes Living Arrangements: Parent Can pt return to current living arrangement?: Yes Admission Status: Involuntary Is patient capable of signing voluntary admission?: No Transfer from: [facility] Hospital [facility]) Referral Source:  (Dr Vanetta Mulders)  Education Status Is patient currently in school?: No Contact person: Junius Roads [facility] [phone]  Risk to self Suicidal Ideation: No Suicidal Intent: No Is patient  at risk for suicide?: No Suicidal Plan?: No Access to Means: No What has been your use of drugs/alcohol within the last 12 months?: Pt denies current use.  UDS + marijuana, benzos Previous Attempts/Gestures: No How many times?: 0  Other Self Harm Risks: na Triggers for Past Attempts: None known Intentional Self Injurious Behavior: None Family Suicide History: No Recent stressful life event(s): Job Loss Persecutory voices/beliefs?: No Depression: Yes Depression Symptoms: Despondent;Isolating;Fatigue;Guilt;Loss of interest in usual pleasures Substance abuse history and/or treatment for substance abuse?: Yes Suicide prevention information given to non-admitted patients: Not applicable  Risk to Others Homicidal Ideation: No Thoughts of Harm to Others: No Current Homicidal Intent: No Current Homicidal Plan: No Access to Homicidal Means: No History of harm to others?: No Assessment of Violence: None Noted Violent Behavior Description: none Does patient have access to weapons?: No Criminal Charges Pending?: No Does patient have a court date: No  Psychosis Hallucinations: None noted Delusions: None noted  Mental Status Report Appear/Hygiene: Other (Comment) (casual) Eye Contact: Good Motor Activity: Unremarkable Speech: Logical/coherent Level of Consciousness: Alert Mood: Other (Comment) (pleasant, cooperative) Affect: Appropriate to circumstance Anxiety Level: None Thought Processes: Coherent;Relevant Judgement: Unimpaired Orientation: Person;Place;Time;Situation Obsessive Compulsive Thoughts/Behaviors: None  Cognitive Functioning Concentration: Normal Memory: Recent Intact;Remote Intact IQ: Average Insight: Good Impulse Control: Fair Appetite: Good Weight Loss: 5  Weight Gain: 0  Sleep: No Change Total Hours of Sleep: 8  Vegetative Symptoms: None  ADLScreening [facility] Assessment Services) Patient's cognitive ability adequate to safely complete daily activities?:  Yes Patient able to express need for assistance with ADLs?: Yes Independently performs ADLs?: Yes (appropriate for developmental age)  Abuse/Neglect [facility]) Physical Abuse: Denies Verbal Abuse: Denies Sexual Abuse: Denies  Prior Inpatient Therapy Prior Inpatient Therapy: Yes Prior Therapy Dates: 2012 [facility] Reason for Treatment: opiate detox  Prior Outpatient Therapy Prior Outpatient Therapy: No  ADL Screening (condition at time of admission) Patient's cognitive ability adequate to safely complete daily activities?: Yes Patient able to express need for assistance with ADLs?: Yes Independently performs ADLs?: Yes (appropriate for developmental age) Weakness of Legs: None Weakness of Arms/Hands: None  Home Assistive Devices/Equipment Home Assistive Devices/Equipment: None  Therapy Consults (therapy consults require a physician order) PT Evaluation Needed: No OT Evalulation Needed: No SLP Evaluation Needed: No Abuse/Neglect Assessment (Assessment to be complete while patient is alone) Physical Abuse: Denies Verbal Abuse: Denies Sexual Abuse: Denies Exploitation of patient/patient's resources: Denies Self-Neglect: Denies Values / Beliefs Cultural Requests During Hospitalization: None Spiritual Requests During Hospitalization: None   Advance Directives (For Healthcare) Advance Directive: Patient does not have advance directive;Patient would not like information Pre-existing out of facility DNR order (yellow form or pink MOST form): No Nutrition Screen- [facility] Adult/[facility] Patient's home diet: Regular  Additional Information 1:1 In Past 12 Months?: No CIRT Risk: No Elopement Risk: No Does patient have medical  clearance?: Yes     Disposition: Discussed this pt with Dr Preston Fleeting of [facility] on this date.  Dr Preston Fleeting reviewed the record and felt that the additional testing recommended in the first CT scan was reasonable and he ordered it,  however, this testing is not available on weekends at [facility], so it will not happen until Monday.  Discussed that pt is doing better the last 2 days.  Dr Preston Fleeting prefers to continue to pursue inpt psych treatment rather than consider if pt could be discharged at this time.  [facility] called back earlier, 1700 and reported pt was declined for admit there.  Pt has been referred to [facility] who is considering pt for wait list.  Unable to get through to [facility] despite several calls today to see if pt is on wait list. Disposition Disposition of Patient: Inpatient treatment program Type of inpatient treatment program: Adult  On Site Evaluation by:   Reviewed with Physician:     Lorri Frederick 09/11/2012 7:50 PM

## 2012-09-11 NOTE — ED Notes (Signed)
Patient has been quiet and sleeping.

## 2012-09-11 NOTE — ED Notes (Signed)
Per Dr. Bernette Mayers, pt no longer has a bed at [facility] due to non-acceptance because unable to perform MRI today.  ACT team consulted again and is working on placement at another facility.

## 2012-09-11 NOTE — ED Notes (Signed)
Provided pt with hospital bed for comfort.  Pt calm, cooperative.  nad noted.  Sitter at bedside.

## 2012-09-11 NOTE — ED Notes (Signed)
Patient walked to restroom. Given Sprite to drink. Made patient aware that no more soft drinks for tonight.

## 2012-09-11 NOTE — ED Notes (Signed)
Greg with ACT team here to see pt

## 2012-09-11 NOTE — BH Assessment (Addendum)
[facility] Assessment Progress Note      09/11/12. ACT contacted by [facility] [place] at 0100 to say that bed available for pt in AM.  ACT recontacted around 0700 by [place] to say that concerns were raised based on telepsych and pt is not accepted unless further medical clearance was completed.  Dr Bernette Mayers at [facility] contacted and explained situation to him.  He does not feel addition MRI is appropriate and asked to speak to [facility].  [facility] recontacted, [facility] spoke with Dr Bernette Mayers.  [facility] unwilling to take pt without further testing.  Dr Bernette Mayers told ACT he had spoken with another [place] physician who concurs with him that additional testing is waste of resources.  Dr Bernette Mayers asked ACT to attempt to place pt at another facility.  Pt had been referred to [facility] on 12/6 but this was cancelled when [facility] accepted pt.  Pt referred to [facility], who does not have beds but will consider pt for wait list.  [facility]: message left requesting call back.  [facility] has no high accuity beds.  [facility] has no Community education officer for [employer].  [facility] has no beds. Daleen Squibb, LCSWA

## 2012-09-12 NOTE — BH Assessment (Addendum)
Assessment Note   Javier Johnston is an 21 y.o. male. 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  Pt reports today that he continues to feel much better.  Denies any psychosis at all.  Denies SI/HI.  Pt understands why admission is still being sought for him and is cooperative both during reassessments and also has been cooperative at [facility].  Further info obtained about substance abuse:  Pt abused pain pills up until treatment at [facility] in12/2012.  Pt reports he has been clean from opiates since 11/2011.  Pt reports he continues to use marijuana currently and very occasionally drinks alcohol.  Pt reports his mother gave him a xanax last Monday in attempt to calm him down.  Pt denies ongoing use/abuse of xanax.  Axis I: Deferred Axis II: Deferred Axis III:  Past Medical History  Diagnosis Date  . Anxiety   . Depression   . Seizure     had 1 seizure approx 2 months ago.  Marland Kitchen Hypertension   . Narcotic dependence, in remission     were treated ibnpatent Dec-Jan 2013   Axis IV: economic problems Axis V: 51-60 moderate symptoms  Past Medical History:  Past Medical History  Diagnosis Date  . Anxiety   . Depression   . Seizure     had 1 seizure approx 2 months ago.  Marland Kitchen Hypertension   . Narcotic dependence, in remission     were treated ibnpatent Dec-Jan 2013    Past Surgical History  Procedure Date  . Appendectomy     16    Family History:  Family History  Problem Relation Age of Onset  . Coronary artery disease Father 72    Social History:  reports that he has been smoking.  He does not have any smokeless tobacco history on file. He reports that he does not drink alcohol or use illicit drugs.  Additional Social History:  Alcohol / Drug Use Pain Medications: Pt denies Prescriptions: PT denies Over the Counter: Pt denies History of alcohol / drug use?: Yes Negative Consequences of Use: Financial;Legal;Personal  relationships Substance #1 Name of Substance 1: alcohol 1 - Age of First Use: 16 1 - Amount (size/oz): 1 beer 1 - Frequency: 1-2x month 1 - Duration: 2 years 1 - Last Use / Amount: 2 weeks ago Substance #2 Name of Substance 2: marijuana 2 - Age of First Use: 16 2 - Amount (size/oz): 1 joint 2 - Frequency: 1x week 2 - Duration: 5 years 2 - Last Use / Amount: 1 month ago  CIWA: CIWA-Ar BP: 127/71 mmHg Pulse Rate: 96  Headache, Fullness in Head: none present COWS: Clinical Opiate Withdrawal Scale (COWS) Resting Pulse Rate: Pulse Rate 81-100 Sweating: Subjective report of chills or flushing Restlessness: Frequent shifting or extraneous movements of legs/arms Pupil Size: Pupils possibly larger than normal for room light Bone or Joint Aches: Not present Runny Nose or Tearing: Not present GI Upset: No GI symptoms Tremor: No tremor Yawning: No yawning Anxiety or Irritability: Patient so irritable or anxious that participation in the assessment is difficult Gooseflesh Skin: Skin is smooth COWS Total Score: 10   Allergies:  Allergies  Allergen Reactions  . Penicillins Other (See Comments)    Unknown Childhood reaction  . Wellbutrin (Bupropion)     Home Medications:  (Not in a hospital admission)  OB/GYN Status:  No LMP for male patient.  General Assessment Data [facility] of Assessment: [facility]  Assessment: Yes Living Arrangements: Parent Can pt return to current living arrangement?: Yes Admission Status: Involuntary Is patient capable of signing voluntary admission?: No [facility] Referral Source:  (Dr Vanetta Mulders)  Education Status Is patient currently in school?: No Contact person: Junius Roads [facility] [phone]  Risk to self Suicidal Ideation: No Suicidal Intent: No Is patient at risk for suicide?: No Suicidal Plan?: No Access to Means: No What has been your use of drugs/alcohol within the last 12  months?: current use of marijuana Previous Attempts/Gestures: No How many times?: 0  Other Self Harm Risks: na Triggers for Past Attempts: None known Intentional Self Injurious Behavior: None Family Suicide History: No Recent stressful life event(s): Job Loss Persecutory voices/beliefs?: No Depression: Yes Depression Symptoms: Despondent;Isolating;Fatigue;Guilt;Loss of interest in usual pleasures Substance abuse history and/or treatment for substance abuse?: Yes Suicide prevention information given to non-admitted patients: Not applicable  Risk to Others Homicidal Ideation: No Thoughts of Harm to Others: No Current Homicidal Intent: No Current Homicidal Plan: No Access to Homicidal Means: No History of harm to others?: No Assessment of Violence: None Noted Violent Behavior Description: none Does patient have access to weapons?: No Criminal Charges Pending?: No Does patient have a court date: No  Psychosis Hallucinations: None noted Delusions: None noted  Mental Status Report Appear/Hygiene: Other (Comment) (casual) Eye Contact: Good Motor Activity: Unremarkable Speech: Logical/coherent Level of Consciousness: Alert Mood: Other (Comment) (pleasant, cooperative) Affect: Appropriate to circumstance Anxiety Level: None Thought Processes: Coherent;Relevant Judgement: Unimpaired Orientation: Person;Place;Time;Situation Obsessive Compulsive Thoughts/Behaviors: None  Cognitive Functioning Concentration: Normal Memory: Recent Intact;Remote Intact IQ: Average Insight: Good Impulse Control: Fair Appetite: Good Weight Loss: 5  Weight Gain: 0  Sleep: Decreased (Pt reports prior to this week, problems sleeping x 6 months) Total Hours of Sleep: 3  Vegetative Symptoms: None  ADLScreening [facility] Assessment Services) Patient's cognitive ability adequate to safely complete daily activities?: Yes Patient able to express need for assistance with ADLs?: Yes Independently performs  ADLs?: Yes (appropriate for developmental age)  Abuse/Neglect [facility]) Physical Abuse: Denies Verbal Abuse: Denies Sexual Abuse: Denies  Prior Inpatient Therapy Prior Inpatient Therapy: Yes Prior Therapy Dates: 2012 Prior Therapy Facilty[facility]Provider([facility] Reason for Treatment: opiate detox  Prior Outpatient Therapy Prior Outpatient Therapy: No  ADL Screening (condition at time of admission) Patient's cognitive ability adequate to safely complete daily activities?: Yes Patient able to express need for assistance with ADLs?: Yes Independently performs ADLs?: Yes (appropriate for developmental age) Weakness of Legs: None Weakness of Arms/Hands: None  Home Assistive Devices/Equipment Home Assistive Devices/Equipment: None  Therapy Consults (therapy consults require a physician order) PT Evaluation Needed: No OT Evalulation Needed: No SLP Evaluation Needed: No Abuse/Neglect Assessment (Assessment to be complete while patient is alone) Physical Abuse: Denies Verbal Abuse: Denies Sexual Abuse: Denies Exploitation of patient/patient's resources: Denies Self-Neglect: Denies Values / Beliefs Cultural Requests During Hospitalization: None Spiritual Requests During Hospitalization: None   Advance Directives (For Healthcare) Advance Directive: Patient does not have advance directive;Patient would not like information Pre-existing out of facility DNR order (yellow form or pink MOST form): No Nutrition Screen- [facility] Patient's home diet: Regular  Additional Information 1:1 In Past 12 Months?: No CIRT Risk: No Elopement Risk: No Does patient have medical clearance?: Yes     Disposition: Pt awaiting psych placement.  [facility] has declined. No beds available at [place] [place].  Pt has been referred to [facility] but unable to confirm with them that pt  on wait list despite multiple attempts to contact. Patient discharged as no longer meeting criteria for inpatient  care. Dr Judd Lien is in agreement with the disposition. Disposition Disposition of Patient: Inpatient treatment program Type of inpatient treatment program: Adult  On Site Evaluation by:   Reviewed with Physician:     Lorri Frederick 09/12/2012 5:54 PM

## 2012-09-12 NOTE — ED Notes (Signed)
Pt has remained calm and cooperative during the duration of this shift.

## 2012-09-12 NOTE — Progress Notes (Signed)
3:36 PM Pt is waiting to have MRI of brain tomorrow.  He is resting peacefully.

## 2012-09-13 ENCOUNTER — Emergency Department (HOSPITAL_COMMUNITY): Payer: Self-pay

## 2012-09-13 ENCOUNTER — Emergency Department (HOSPITAL_COMMUNITY): Admit: 2012-09-13 | Payer: Self-pay

## 2012-09-13 MED ORDER — GADOBENATE DIMEGLUMINE 529 MG/ML IV SOLN
11.0000 mL | Freq: Once | INTRAVENOUS | Status: AC | PRN
  Administered 2012-09-13: 11 mL via INTRAVENOUS

## 2012-09-13 NOTE — BHH Counselor (Signed)
+  This morning the patient's father called Ms Hyacinth Meeker ACT Member and reported that he wanted to take his son home. He reports that his son is back to himself and that everything is fine.Ms Hyacinth Meeker called this Clinical research associate and asked that the patient be interviewed and discharged if appropriate. Patient is now alert and oriented. He denies any hallucinations and does not appear delusional. He denies any suicidal or homicidal thoughts. He request to go home and follow up as an outpatient. His father is in the room and he states that the old Timothy Lasso is back. Both feel that his psychosis was the result of the medications. Both agree that he will go to [facility] for further evaluations and medications if incidated. The father assure writer that he will bring his son to the ED if the need arises in the future. Discussed with Dr Judd Lien , who will now discharge the patient. The patient will cantract for safety.

## 2012-09-13 NOTE — ED Notes (Signed)
Call from [place] from [facility], stating patient is on waiting list for [facility].

## 2012-09-13 NOTE — ED Notes (Signed)
Patient belongings given back to patient at this time. Patient allow to get dressed and will be discharged.

## 2012-09-13 NOTE — ED Notes (Signed)
Patient transported to MRI. Radiology staff notified that patient is to have sitter remain with him throughout procedure. Sitter escorted patient to MRI department.

## 2012-09-13 NOTE — ED Notes (Signed)
Patient contracted for safety with [facility]. Patient with no complaints at this time. Respirations even and unlabored. Skin warm/dry. Discharge and follow up instructions reviewed with patient at this time. Patient agreeable. Patient given opportunity to voice concerns/ask questions. Patient discharged at this time and left Emergency Department with steady gait.

## 2014-06-02 ENCOUNTER — Ambulatory Visit (HOSPITAL_COMMUNITY)
Admission: RE | Admit: 2014-06-02 | Discharge: 2014-06-02 | Disposition: A | Discharge status: Home / Self Care | Payer: Self-pay | Source: Ambulatory Visit | Attending: Family Medicine | Admitting: Family Medicine

## 2014-06-02 ENCOUNTER — Other Ambulatory Visit (HOSPITAL_COMMUNITY): Payer: Self-pay | Admitting: Family Medicine

## 2014-06-02 DIAGNOSIS — M25539 Pain in unspecified wrist: Secondary | ICD-10-CM | POA: Insufficient documentation

## 2014-06-02 DIAGNOSIS — M25522 Pain in left elbow: Secondary | ICD-10-CM

## 2019-02-12 ENCOUNTER — Other Ambulatory Visit: Payer: Self-pay

## 2019-02-12 ENCOUNTER — Encounter (HOSPITAL_BASED_OUTPATIENT_CLINIC_OR_DEPARTMENT_OTHER): Payer: Self-pay | Admitting: *Deleted

## 2019-02-12 ENCOUNTER — Emergency Department (HOSPITAL_BASED_OUTPATIENT_CLINIC_OR_DEPARTMENT_OTHER)
Admission: EM | Admit: 2019-02-12 | Discharge: 2019-02-12 | Disposition: A | Discharge status: Home / Self Care | Payer: 59 | Attending: Emergency Medicine | Admitting: Emergency Medicine

## 2019-02-12 DIAGNOSIS — Z79899 Other long term (current) drug therapy: Secondary | ICD-10-CM | POA: Diagnosis not present

## 2019-02-12 DIAGNOSIS — M25512 Pain in left shoulder: Secondary | ICD-10-CM | POA: Diagnosis not present

## 2019-02-12 DIAGNOSIS — G8929 Other chronic pain: Secondary | ICD-10-CM

## 2019-02-12 DIAGNOSIS — I10 Essential (primary) hypertension: Secondary | ICD-10-CM | POA: Diagnosis not present

## 2019-02-12 DIAGNOSIS — F17210 Nicotine dependence, cigarettes, uncomplicated: Secondary | ICD-10-CM | POA: Insufficient documentation

## 2019-02-12 MED ORDER — MELOXICAM 7.5 MG PO TABS: 7.5000 mg | ORAL_TABLET | Freq: Every day | ORAL | 0 refills | Status: DC
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
## 2019-02-12 NOTE — ED Notes (Signed)
ED Provider at bedside. 
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
## 2019-02-12 NOTE — Discharge Instructions (Addendum)
I have given you a prescription for Mobic (meloxicam) today.  Mobic is a NSAID medication and you should not take it with other NSAIDs.  Examples of other NSAIDS include motrin, ibuprofen, aleve, naproxen, and Voltaren.  Please monitor your bowel movements for dark, tarry, sticky stools. If you have any bowel movements like this you need to stop taking mobic and call your doctor as this may represent a stomach ulcer from taking NSAIDS.    Please take Tylenol (acetaminophen) to relieve your pain.  You may take tylenol, up to 1,000 mg (two extra strength pills).  Do not take more than 3,000 mg tylenol in a 24 hour period.  Please check all medication labels as many medications such as pain and cold medications may contain tylenol. Please do not drink alcohol while taking this medication.   While in the emergency room your blood pressure was elevated.  Please follow-up with your primary care doctor about this.

## 2019-05-09 ENCOUNTER — Other Ambulatory Visit: Payer: Self-pay | Admitting: Internal Medicine

## 2019-05-09 DIAGNOSIS — Z20822 Contact with and (suspected) exposure to covid-19: Secondary | ICD-10-CM

## 2019-05-10 LAB — NOVEL CORONAVIRUS, NAA: SARS-CoV-2, NAA: NOT DETECTED

## 2019-05-12 ENCOUNTER — Telehealth: Payer: Self-pay

## 2019-05-12 NOTE — Telephone Encounter (Signed)
Contacted pt. Via his girlfriend's cell phone.  Pt. Identified himself with correct dob.  Advised of negative COVID results.  Verb. Understanding.  Denied any ill feeling; reported he has intermittent headache, but this is not unusual for him.  Denied cough, fever, or shortness of breath.

## 2019-05-16 ENCOUNTER — Telehealth: Payer: Self-pay

## 2019-05-16 NOTE — Telephone Encounter (Signed)
rec'd request via Agent Debra, that pt. Has requested to fax COVID results to his employer.  Pt. Provided fax # [phone] , to the Attention of Carloyn Manner and [place].  Attempted to fax COVID test x 2 and above fax number did not go through.  Attempted to call pt.  Left vm to call back to confirm correct fax number.  Call placed to the home #; spoke with pt's mother.  She will give him message to call back with correct fax number.  Advised that pt. Should call back to [phone].  Verb. Understanding.

## 2019-05-16 NOTE — Telephone Encounter (Signed)
Pt. Returned call to give updated fax #; requested that COVID result be faxed to [phone], attention Carloyn Manner and [place].  Advised this report will be faxed now.  Agreed with plan.

## 2019-05-17 NOTE — Telephone Encounter (Signed)
Patient called in stating that fax did not have information that employer needed. Patient requesting it be sent again. Results sent again to employer and informed patient via VM that it was done.

## 2020-01-09 ENCOUNTER — Other Ambulatory Visit: Payer: Self-pay

## 2020-01-09 ENCOUNTER — Ambulatory Visit: Payer: HRSA Program | Attending: Internal Medicine

## 2020-01-09 DIAGNOSIS — Z20822 Contact with and (suspected) exposure to covid-19: Secondary | ICD-10-CM | POA: Diagnosis not present

## 2020-01-10 LAB — NOVEL CORONAVIRUS, NAA: SARS-CoV-2, NAA: NOT DETECTED

## 2020-01-10 LAB — SARS-COV-2, NAA 2 DAY TAT

## 2021-03-19 ENCOUNTER — Other Ambulatory Visit: Payer: Self-pay

## 2021-03-19 ENCOUNTER — Emergency Department (HOSPITAL_BASED_OUTPATIENT_CLINIC_OR_DEPARTMENT_OTHER): Payer: Self-pay

## 2021-03-19 ENCOUNTER — Emergency Department (HOSPITAL_BASED_OUTPATIENT_CLINIC_OR_DEPARTMENT_OTHER)
Admission: EM | Admit: 2021-03-19 | Discharge: 2021-03-19 | Disposition: A | Discharge status: Home / Self Care | Payer: Self-pay | Attending: Emergency Medicine | Admitting: Emergency Medicine

## 2021-03-19 ENCOUNTER — Encounter (HOSPITAL_BASED_OUTPATIENT_CLINIC_OR_DEPARTMENT_OTHER): Payer: Self-pay | Admitting: Emergency Medicine

## 2021-03-19 DIAGNOSIS — M79645 Pain in left finger(s): Secondary | ICD-10-CM | POA: Insufficient documentation

## 2021-03-19 DIAGNOSIS — I10 Essential (primary) hypertension: Secondary | ICD-10-CM | POA: Insufficient documentation

## 2021-03-19 DIAGNOSIS — Z87891 Personal history of nicotine dependence: Secondary | ICD-10-CM | POA: Insufficient documentation

## 2021-03-19 DIAGNOSIS — Z79899 Other long term (current) drug therapy: Secondary | ICD-10-CM | POA: Insufficient documentation

## 2021-03-19 NOTE — ED Provider Notes (Signed)
[facility] EMERGENCY DEPARTMENT Provider Note   CSN: [id number] Arrival date & time: 03/19/21  5974     History Chief Complaint  Patient presents with   Hand Pain    Javier Johnston is a 30 y.o. male.  Pain to his left ring finger for the last 2 weeks.  Works with his hands.  Has pain at times when he tries to close his fingers into a fist.  Denies any trauma.  No fever.  The history is provided by the patient.  Hand Pain This is a new problem. The current episode started more than 1 week ago. The problem occurs daily. The problem has not changed since onset.Nothing aggravates the symptoms. Nothing relieves the symptoms. He has tried nothing for the symptoms. The treatment provided no relief.      Past Medical History:  Diagnosis Date   Anxiety    Depression    Hypertension    Narcotic dependence, in remission [facility])    were treated ibnpatent Dec-Jan 2013   Seizure (HCC)    had 1 seizure approx 2 months ago.    Patient Active Problem List   Diagnosis Date Noted   Pyelonephritis 05/19/2012   Acute renal failure (HCC) 05/18/2012   Nausea and vomiting 05/18/2012   Anxiety and depression 05/18/2012   Hyperglycemia 05/18/2012    Past Surgical History:  Procedure Laterality Date   APPENDECTOMY     16       Family History  Problem Relation Age of Onset   Coronary artery disease Father 52    Social History   Tobacco Use   Smoking status: Former    Packs/day: 0.50    Years: 4.50    Pack years: 2.25    Types: Cigarettes    Quit date: 07/06/2020    Years since quitting: 0.7   Smokeless tobacco: Former  Substance Use Topics   Alcohol use: Not Currently    Comment: occ   Drug use: Yes    Types: Marijuana    Home Medications Prior to Admission medications   Medication Sig Start Date End Date Taking? Authorizing Provider  amLODipine (NORVASC) 5 MG tablet Take 5 mg by mouth daily.    [provider]  escitalopram (LEXAPRO) 10 MG  tablet Take 10 mg by mouth daily.    [provider]  gabapentin (NEURONTIN) 100 MG capsule Take 100 mg by mouth 3 (three) times daily.    [provider]  meloxicam (MOBIC) 7.5 MG tablet Take 1 tablet (7.5 mg total) by mouth daily. 02/12/19   Cristina Gong, PA-C    Allergies    Penicillins, Prozac [fluoxetine hcl], and Wellbutrin [bupropion]  Review of Systems   Review of Systems  Constitutional:  Negative for chills and fever.  Musculoskeletal:  Positive for arthralgias. Negative for back pain.  Skin:  Negative for color change and rash.  Neurological:  Negative for seizures, weakness and numbness.  All other systems reviewed and are negative.  Physical Exam Updated Vital Signs BP (!) 159/98 (BP Location: Right Arm)   Pulse 81   Temp 98.6 F (37 C)   Resp 18   Ht 5\' 6"  (1.676 m)   Wt 65.8 kg   SpO2 100%   BMI 23.40 kg/m   Physical Exam Constitutional:      General: He is not in acute distress.    Appearance: He is not ill-appearing.  Cardiovascular:     Pulses: Normal pulses.  Musculoskeletal:  General: No swelling.     Cervical back: Normal range of motion. No tenderness.     Comments: Some tenderness at the base of the left ring finger with overall good range of motion with some pain at the end of finger flexion, overall good range of motion with no obvious laxity at the left index finger, there is no redness or swelling  Skin:    General: Skin is warm.     Findings: No erythema.  Neurological:     General: No focal deficit present.     Mental Status: He is alert.     Sensory: No sensory deficit.     Motor: No weakness.    ED Results / Procedures / Treatments   Labs (all labs ordered are listed, but only abnormal results are displayed) Labs Reviewed - No data to display  EKG None  Radiology DG Hand Complete Left  Result Date: 03/19/2021 CLINICAL DATA:  Left ring finger pain EXAM: LEFT HAND - COMPLETE 3+ VIEW COMPARISON:   07/02/2011 FINDINGS: There is no evidence of fracture or dislocation. There is no evidence of arthropathy or other focal bone abnormality. Tiny mineralized density along the volar aspect of the ring finger middle phalanx base at the level of the PIP joint, nonspecific. Soft tissues are unremarkable. IMPRESSION: 1. No acute osseous abnormality, left hand. 2. Tiny mineralized density along the volar aspect of the ring finger middle phalanx base at the level of the PIP joint, nonspecific and may reflect sequela of remote trauma. Electronically Signed   By: Duanne Guess D.O.   On: 03/19/2021 08:05    Procedures Procedures   Medications Ordered in ED Medications - No data to display  ED Course  I have reviewed the triage vital signs and the nursing notes.  Pertinent labs & imaging results that were available during my care of the patient were reviewed by me and considered in my medical decision making (see chart for details).    MDM Rules/Calculators/A&P                          [name] here with pain in his left ring finger that has been there for 2 weeks.  Works with his hands.  Normal vitals.  No fever.  No signs of infection on exam.  Some mild tenderness at the base of the left ring finger especially with flexion of the left ring finger.  No concern for infectious process.  X-ray negative for fracture but did show some tiny mineralized density along the volar aspect of the ring finger middle phalanx at the level of the PIP joint.  Overall nonspecific could be from all trauma.  Conservative treatment with rest, Tylenol, Motrin.  Follow-up with hand if still having issues.  Discharged in good condition.  Neurovascularly neuromuscularly intact.  This chart was dictated using voice recognition software.  Despite best efforts to proofread,  errors can occur which can change the documentation meaning.   Final Clinical Impression(s) / ED Diagnoses Final diagnoses:  Finger pain, left     Rx / DC Orders ED Discharge Orders     None        Virgina Norfolk, DO 03/19/21 9147

## 2021-03-19 NOTE — ED Notes (Signed)
ED Provider at bedside. 

## 2021-03-19 NOTE — ED Notes (Signed)
Pt endorses 600mg   ibuprofen at 0400 today with no relief.

## 2021-03-19 NOTE — ED Triage Notes (Signed)
Pt arrives pov with c/o L 4th digit pain and swelling x 2 weeks. Pt denies recent injury, endorses past dislocation injury. Pt c/o inability to "make a fist".

## 2021-03-19 NOTE — ED Notes (Signed)
Pt ambulated to restroom normal  gait.

## 2021-12-03 ENCOUNTER — Ambulatory Visit: Payer: Medicaid Other | Admitting: Internal Medicine

## 2022-01-21 IMAGING — DX DG HAND COMPLETE 3+V*L*
3 series · 3 of 3 positions shown · non-contrast
Comparison: 07/02/2011

CLINICAL DATA: Left ring finger pain

EXAM:
LEFT HAND - COMPLETE 3+ VIEW

[hand pa]
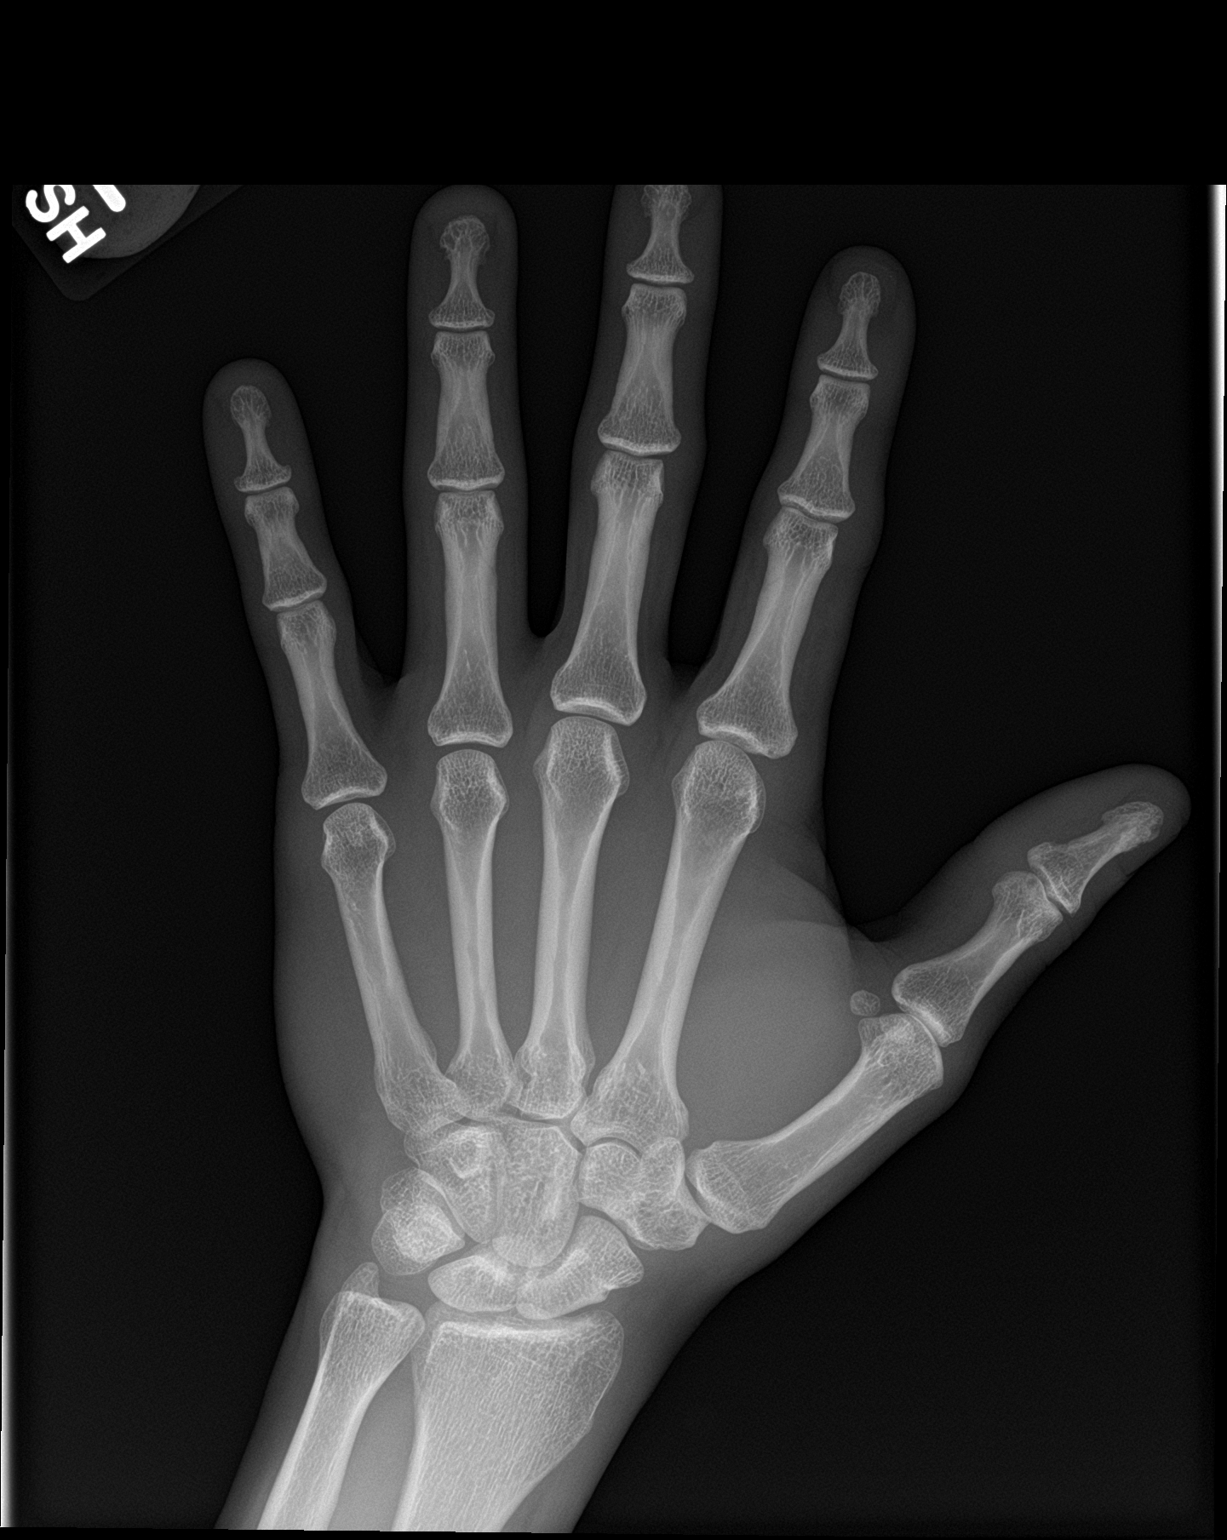

[hand obl]
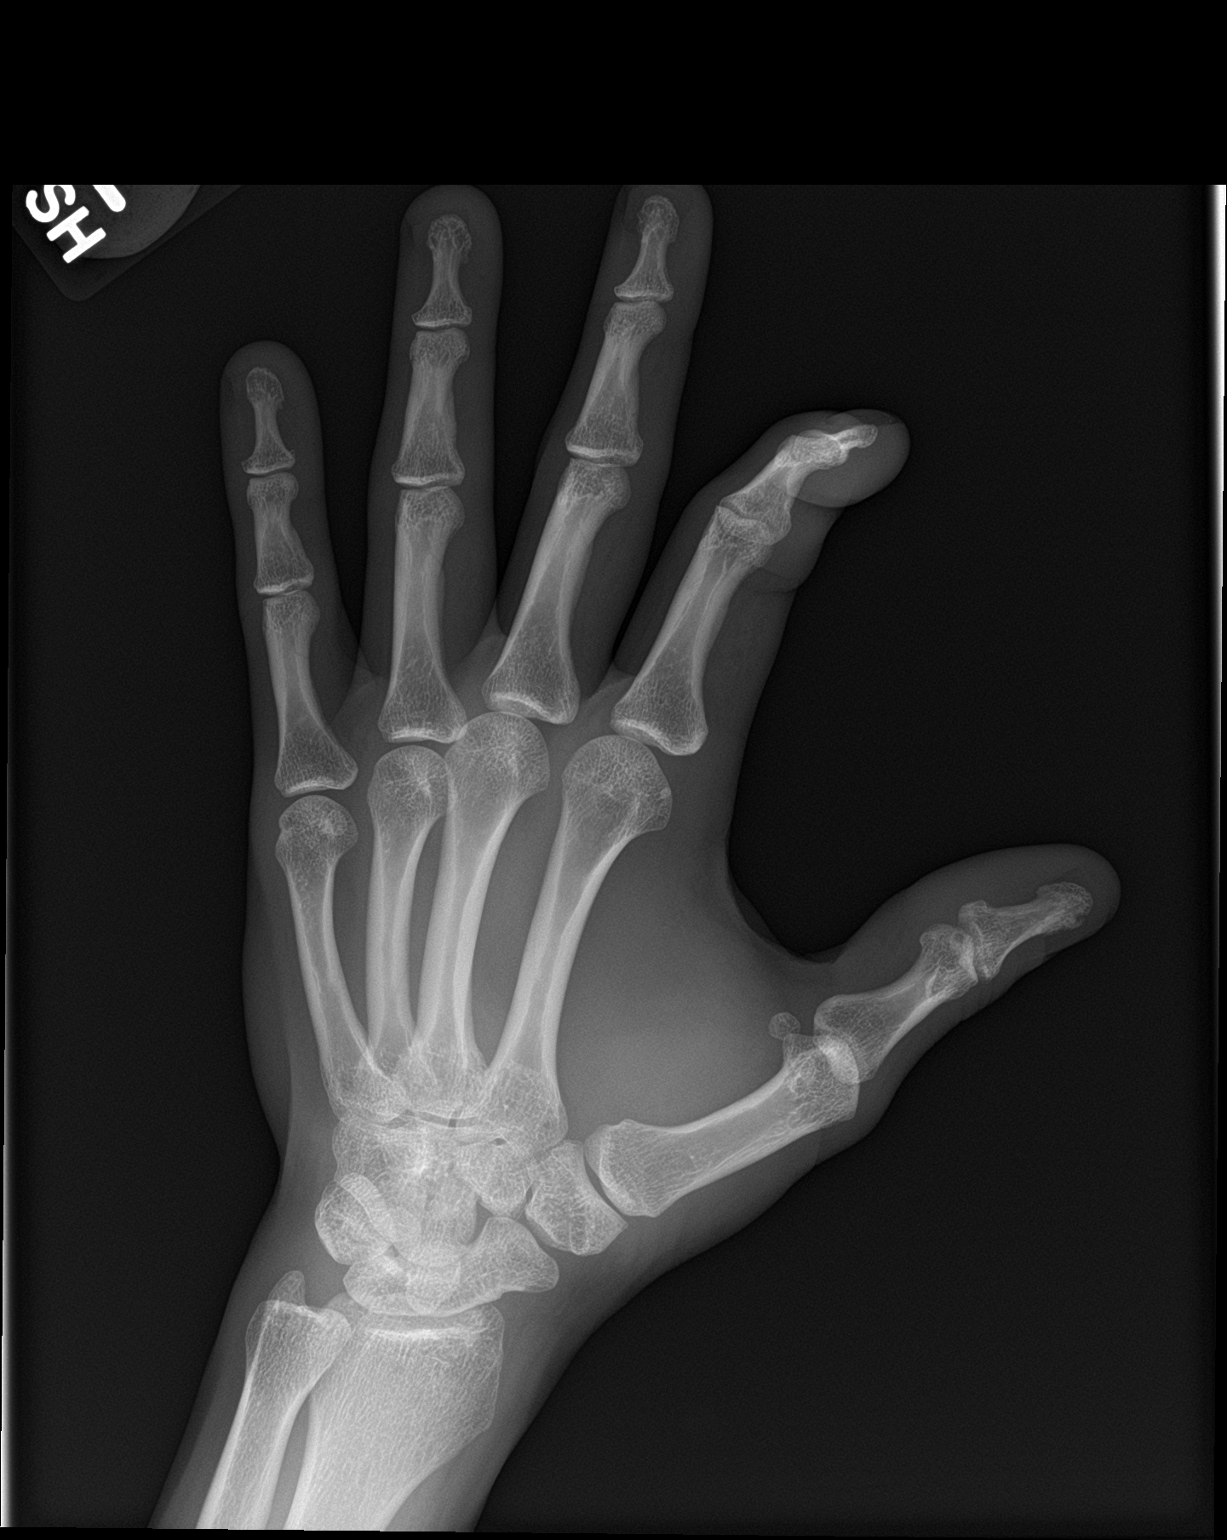

[hand lat]
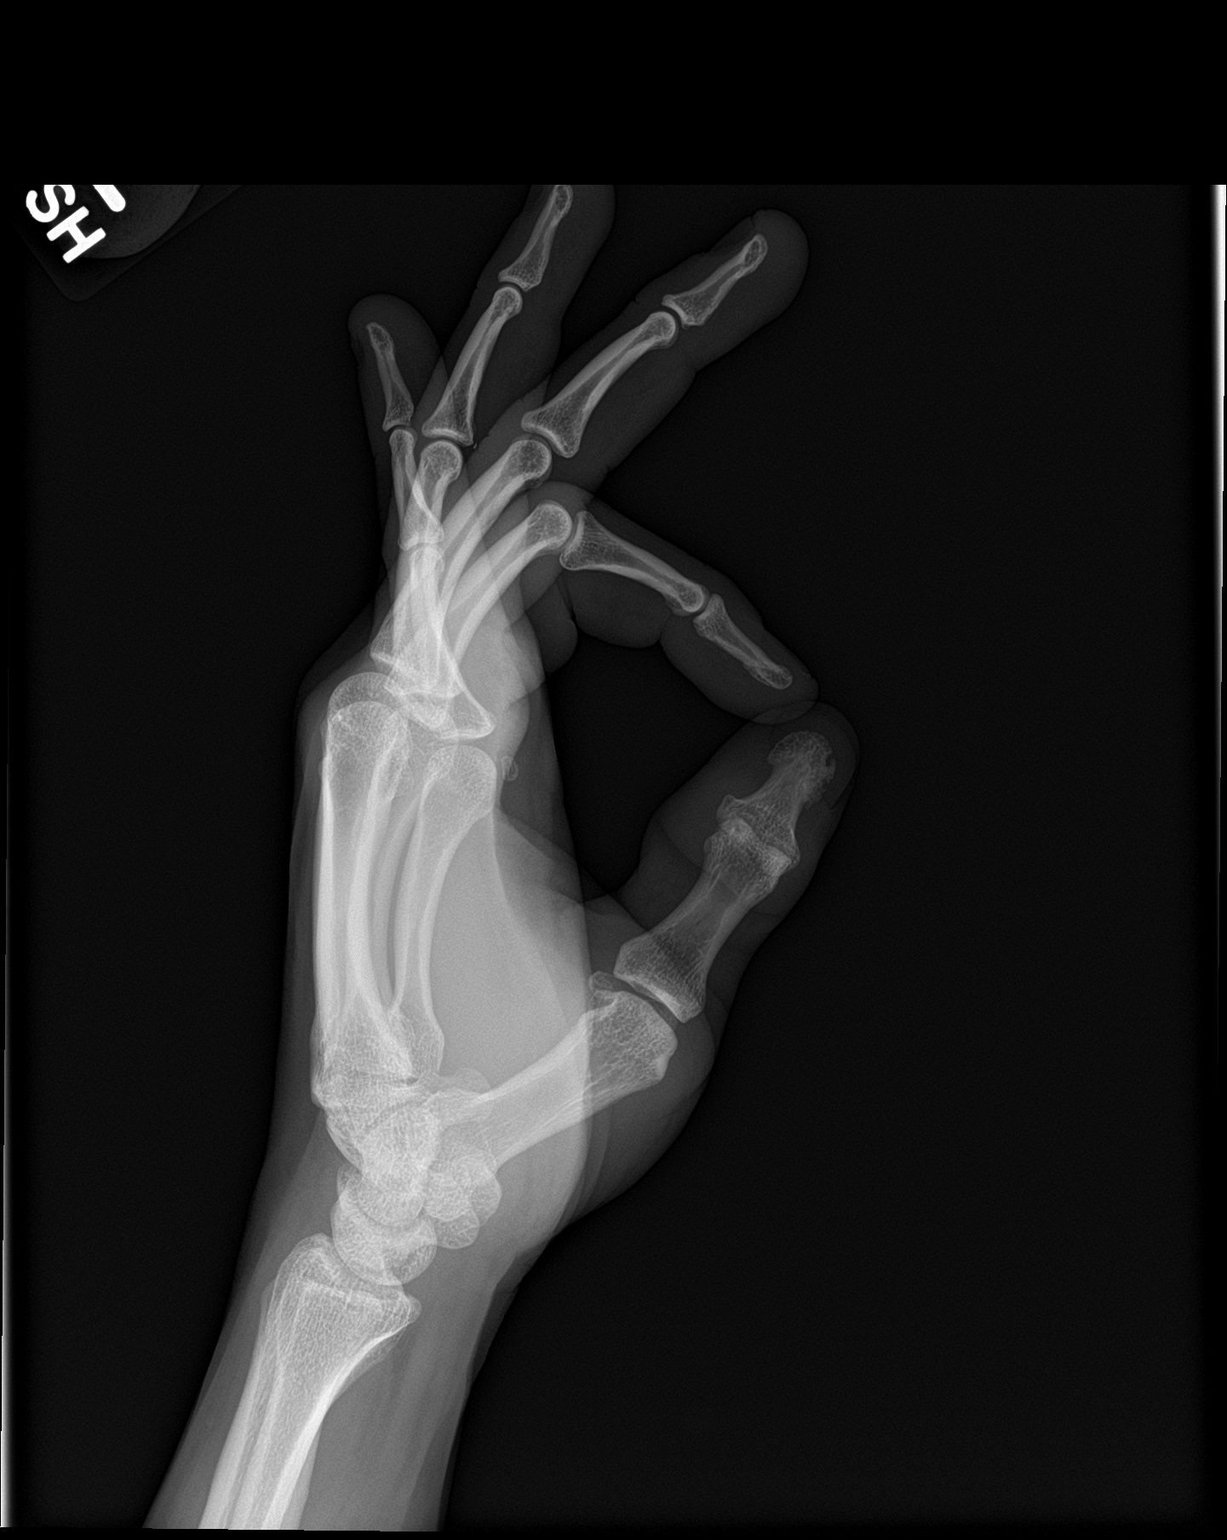

[3 of 3 positions shown; findings below may reference images not displayed]

FINDINGS: There is no evidence of fracture or dislocation. There is no
evidence of arthropathy or other focal bone abnormality. Tiny
mineralized density along the volar aspect of the ring finger middle
phalanx base at the level of the PIP joint, nonspecific. Soft
tissues are unremarkable.
IMPRESSION: 1. No acute osseous abnormality, left hand.
2. Tiny mineralized density along the volar aspect of the ring
finger middle phalanx base at the level of the PIP joint,
nonspecific and may reflect sequela of remote trauma.

## 2022-02-19 ENCOUNTER — Ambulatory Visit: Payer: Medicaid Other | Admitting: Internal Medicine

## 2023-07-13 ENCOUNTER — Emergency Department (HOSPITAL_COMMUNITY)
Admission: EM | Admit: 2023-07-13 | Discharge: 2023-07-13 | Disposition: A | Discharge status: Home / Self Care | Payer: 59 | Attending: Emergency Medicine | Admitting: Emergency Medicine

## 2023-07-13 ENCOUNTER — Other Ambulatory Visit: Payer: Self-pay

## 2023-07-13 DIAGNOSIS — R519 Headache, unspecified: Secondary | ICD-10-CM | POA: Insufficient documentation

## 2023-07-13 DIAGNOSIS — K529 Noninfective gastroenteritis and colitis, unspecified: Secondary | ICD-10-CM | POA: Insufficient documentation

## 2023-07-13 MED ORDER — KETOROLAC TROMETHAMINE 15 MG/ML IJ SOLN
15.0000 mg | Freq: Once | INTRAMUSCULAR | Status: AC
  Administered 2023-07-13: 15 mg via INTRAMUSCULAR
  Filled 2023-07-13: qty 1

## 2023-07-13 MED ORDER — DIPHENHYDRAMINE HCL 25 MG PO CAPS
25.0000 mg | ORAL_CAPSULE | Freq: Once | ORAL | Status: AC
  Administered 2023-07-13: 25 mg via ORAL
  Filled 2023-07-13: qty 1

## 2023-07-13 MED ORDER — PROCHLORPERAZINE EDISYLATE 10 MG/2ML IJ SOLN
10.0000 mg | Freq: Once | INTRAMUSCULAR | Status: AC
  Administered 2023-07-13: 10 mg via INTRAMUSCULAR
  Filled 2023-07-13: qty 2

## 2023-07-13 MED ORDER — ONDANSETRON 4 MG PO TBDP: 4.0000 mg | ORAL_TABLET | Freq: Three times a day (TID) | ORAL | 0 refills | Status: DC | PRN

## 2023-07-13 NOTE — ED Triage Notes (Signed)
Pt arrived reporting migraine x1 day. States also has drank a protein shake last night and since then has been experiencing nausea, vomiting, few episodes of diarrhea.

## 2023-07-13 NOTE — ED Provider Notes (Signed)
[place] EMERGENCY DEPARTMENT AT [facility] Provider Note   CSN: [id number] Arrival date & time: 07/13/23  6213     History  Chief Complaint  Patient presents with   Migraine    Javier Johnston is a 32 y.o. male.  32 year old male presents today for concern of symptoms consistent with gastroenteritis that started yesterday after drinking a meal replacement shake called boost.  He states he does have history of migraines and typically when he gets sick the migraine flares up.  Does endorse photophobia.  He states he has diarrhea, nausea and vomiting.  Has had several episodes of vomiting.  No neck pain, fever.  The history is provided by the patient. No language interpreter was used.       Home Medications Prior to Admission medications   Medication Sig Start Date End Date Taking? Authorizing Provider  amLODipine (NORVASC) 5 MG tablet Take 5 mg by mouth daily.    [provider]  escitalopram (LEXAPRO) 10 MG tablet Take 10 mg by mouth daily.    [provider]  gabapentin (NEURONTIN) 100 MG capsule Take 100 mg by mouth 3 (three) times daily.    [provider]  meloxicam (MOBIC) 7.5 MG tablet Take 1 tablet (7.5 mg total) by mouth daily. 02/12/19   Cristina Gong, PA-C      Allergies    Penicillins, Prozac [fluoxetine hcl], and Wellbutrin [bupropion]    Review of Systems   Review of Systems  Constitutional:  Negative for chills and fever.  Eyes:  Positive for photophobia. Negative for visual disturbance.  Respiratory:  Negative for shortness of breath.   Gastrointestinal:  Positive for diarrhea, nausea and vomiting. Negative for abdominal pain.  Musculoskeletal:  Negative for neck pain and neck stiffness.  Neurological:  Positive for headaches. Negative for light-headedness.  All other systems reviewed and are negative.   Physical Exam Updated Vital Signs BP (!) 130/94 (BP Location: Left Arm)   Pulse 78   Temp 97.7 F  (36.5 C) (Oral)   Resp 17   Ht 5\' 6"  (1.676 m)   Wt 65.8 kg   SpO2 100%   BMI 23.41 kg/m  Physical Exam Vitals and nursing note reviewed.  Constitutional:      General: He is not in acute distress.    Appearance: Normal appearance. He is not ill-appearing.  HENT:     Head: Normocephalic and atraumatic.     Nose: Nose normal.  Eyes:     Conjunctiva/sclera: Conjunctivae normal.  Cardiovascular:     Rate and Rhythm: Normal rate and regular rhythm.  Pulmonary:     Effort: Pulmonary effort is normal. No respiratory distress.  Abdominal:     General: There is no distension.     Palpations: Abdomen is soft.     Tenderness: There is no abdominal tenderness. There is no guarding.  Musculoskeletal:        General: No deformity. Normal range of motion.  Skin:    Findings: No rash.  Neurological:     Mental Status: He is alert.     ED Results / Procedures / Treatments   Labs (all labs ordered are listed, but only abnormal results are displayed) Labs Reviewed - No data to display  EKG None  Radiology No results found.  Procedures Procedures    Medications Ordered in ED Medications  ketorolac (TORADOL) 15 MG/ML injection 15 mg (has no administration in time range)  prochlorperazine (COMPAZINE) injection 10 mg (has  no administration in time range)  diphenhydrAMINE (BENADRYL) capsule 25 mg (has no administration in time range)    ED Course/ Medical Decision Making/ A&P                                 Medical Decision Making Risk Prescription drug management.   32 year old male presents today for concern of symptoms of gastroenteritis as well as migraine headache.  Does have history of migraine.  Will provide migraine cocktail and reevaluate.  Abdominal exam is overall benign.  Without episodes of vomiting in the emergency department.  After migraine cocktail he reports significant improvement.  He feels comfortable with discharge.  No neck pain or neck stiffness.   Afebrile.  Low suspicion for meningitis.  Will prescribe Zofran.  Return precautions discussed.  Patient voices understanding and is in agreement with plan.   Final Clinical Impression(s) / ED Diagnoses Final diagnoses:  Bad headache  Gastroenteritis    Rx / DC Orders ED Discharge Orders          Ordered    ondansetron (ZOFRAN-ODT) 4 MG disintegrating tablet  Every 8 hours PRN        07/13/23 0903              Marita Kansas, PA-C 07/13/23 1610    Bethann Berkshire, MD 07/14/23 1148

## 2023-07-13 NOTE — Discharge Instructions (Signed)
Your exam today was overall reassuring.  Your symptoms improved with the medications you are provided in the emergency room.  You likely have a stomach bug which triggered your migraine.  I have prescribed Zofran for you.  For any concerning symptoms return to the emergency room otherwise follow-up with your primary care provider.

## 2023-07-13 NOTE — ED Notes (Signed)
D/c with instructions. All questions answered. NAD

## 2023-10-27 DIAGNOSIS — J040 Acute laryngitis: Secondary | ICD-10-CM | POA: Diagnosis not present

## 2023-10-27 DIAGNOSIS — R0981 Nasal congestion: Secondary | ICD-10-CM | POA: Diagnosis not present

## 2023-10-27 DIAGNOSIS — R070 Pain in throat: Secondary | ICD-10-CM | POA: Diagnosis not present

## 2024-01-06 ENCOUNTER — Ambulatory Visit: Payer: 59 | Admitting: Physician Assistant

## 2024-01-13 ENCOUNTER — Ambulatory Visit
Admission: RE | Admit: 2024-01-13 | Discharge: 2024-01-13 | Disposition: A | Discharge status: Home / Self Care | Source: Ambulatory Visit

## 2024-01-13 ENCOUNTER — Other Ambulatory Visit: Payer: Self-pay

## 2024-01-13 VITALS — BP 135/89 | HR 74 | Temp 97.8°F | Resp 20

## 2024-01-13 DIAGNOSIS — K0889 Other specified disorders of teeth and supporting structures: Secondary | ICD-10-CM | POA: Diagnosis not present

## 2024-01-13 MED ORDER — IBUPROFEN 800 MG PO TABS: 800.0000 mg | ORAL_TABLET | Freq: Three times a day (TID) | ORAL | 0 refills | Status: DC | PRN

## 2024-01-13 MED ORDER — AMOXICILLIN-POT CLAVULANATE 875-125 MG PO TABS: 1.0000 | ORAL_TABLET | Freq: Two times a day (BID) | ORAL | 0 refills | Status: AC

## 2024-01-13 NOTE — Discharge Instructions (Addendum)
 Start taking the Augmentin twice daily for 7 days to treat for dental infection inside your jaw.  Recommend continuing ibuprofen 800 mg every 8 hours alternating with Tylenol 500 to 1000 mg every 6 hours as needed for pain.  Also recommend cool compresses to the outside of your face.  Follow-up closely with dentist.  Seek care if symptoms do not improve with treatment.

## 2024-01-13 NOTE — ED Triage Notes (Signed)
 Pt reports dental pain x3 days. Pt reports does not have dentist currently and reports also started taking leftover antibiotic from past encounter, unsure of exp date.

## 2024-01-13 NOTE — ED Provider Notes (Signed)
 [facility]    CSN: [id number] Arrival date & time: 01/13/24  1138      History   Chief Complaint Chief Complaint  Patient presents with   Abscess    I don't have a dentist yet and I have a bad tooth infection and I just need to get some antibiotics and something to take the swelling down and pain - Entered by patient    HPI Javier Johnston is a 33 y.o. male.   Patient presents today for 1 week history of dentalgia of the left lower jaw.  Reports he feels like it is swollen.  He has been taking ibuprofen and leftover amoxicillin which has not really helped with symptoms.  He denies fevers or vomiting but has felt a little bit nauseous for the past couple of days.  Is difficult to eat because of the pain he is in.  Patient reports history of similar.  Is trying to find a dentist who will help perform dental work.  Of note, patient has allergy to penicillins on medical record.  He reports he has tolerated amoxicillin well in the past and has not had any issues since he has been taking at this time.  No rash, itching, shortness of breath, throat or tongue swelling currently.    Past Medical History:  Diagnosis Date   Anxiety    Depression    Hypertension    Narcotic dependence, in remission [facility])    were treated ibnpatent Dec-Jan 2013   Seizure (HCC)    had 1 seizure approx 2 months ago.    Patient Active Problem List   Diagnosis Date Noted   Pyelonephritis 05/19/2012   Acute renal failure (HCC) 05/18/2012   Nausea and vomiting 05/18/2012   Anxiety and depression 05/18/2012   Hyperglycemia 05/18/2012    Past Surgical History:  Procedure Laterality Date   APPENDECTOMY     16       Home Medications    Prior to Admission medications   Medication Sig Start Date End Date Taking? Authorizing Provider  amoxicillin-clavulanate (AUGMENTIN) 875-125 MG tablet Take 1 tablet by mouth 2 (two) times daily for 7 days. 01/13/24 01/20/24 Yes Valentino Nose, NP   buprenorphine-naloxone (SUBOXONE) 8-2 mg SUBL SL tablet Place under the tongue.   Yes [provider]  ibuprofen (ADVIL) 800 MG tablet Take 1 tablet (800 mg total) by mouth every 8 (eight) hours as needed. Take with food to prevent GI upset 01/13/24  Yes Cathlean Marseilles A, NP  amLODipine (NORVASC) 5 MG tablet Take 5 mg by mouth daily.    [provider]  escitalopram (LEXAPRO) 10 MG tablet Take 10 mg by mouth daily.    [provider]  gabapentin (NEURONTIN) 100 MG capsule Take 100 mg by mouth 3 (three) times daily.    [provider]  meloxicam (MOBIC) 7.5 MG tablet Take 1 tablet (7.5 mg total) by mouth daily. 02/12/19   Cristina Gong, PA-C  ondansetron (ZOFRAN-ODT) 4 MG disintegrating tablet Take 1 tablet (4 mg total) by mouth every 8 (eight) hours as needed. 07/13/23   Marita Kansas, PA-C    Family History Family History  Problem Relation Age of Onset   Coronary artery disease Father 58    Social History Social History   Tobacco Use   Smoking status: Former    Current packs/day: 0.00    Average packs/day: 0.5 packs/day for 4.5 years (2.2 ttl pk-yrs)    Types: Cigarettes  Start date: 01/06/2016    Quit date: 07/06/2020    Years since quitting: 3.5   Smokeless tobacco: Former  Building services engineer status: Every Day  Substance Use Topics   Alcohol use: Not Currently    Comment: occ   Drug use: Not Currently    Types: Marijuana     Allergies   Penicillins, Prozac [fluoxetine hcl], and Wellbutrin [bupropion]   Review of Systems Review of Systems Per HPI  Physical Exam Triage Vital Signs ED Triage Vitals  Encounter Vitals Group     BP 01/13/24 1141 135/89     Systolic BP Percentile --      Diastolic BP Percentile --      Pulse Rate 01/13/24 1141 74     Resp 01/13/24 1141 20     Temp 01/13/24 1141 97.8 F (36.6 C)     Temp Source 01/13/24 1141 Oral     SpO2 01/13/24 1141 96 %     Weight --      Height --      Head  Circumference --      Peak Flow --      Pain Score 01/13/24 1144 8     Pain Loc --      Pain Education --      Exclude from Growth Chart --    No data found.  Updated Vital Signs BP 135/89 (BP Location: Right Arm)   Pulse 74   Temp 97.8 F (36.6 C) (Oral)   Resp 20   SpO2 96%   Visual Acuity Right Eye Distance:   Left Eye Distance:   Bilateral Distance:    Right Eye Near:   Left Eye Near:    Bilateral Near:     Physical Exam Vitals and nursing note reviewed.  Constitutional:      General: He is not in acute distress.    Appearance: Normal appearance. He is not toxic-appearing.  HENT:     Head: Normocephalic and atraumatic.     Right Ear: External ear normal.     Left Ear: External ear normal.     Nose: Nose normal. No congestion or rhinorrhea.     Mouth/Throat:     Mouth: Mucous membranes are moist.     Dentition: Abnormal dentition. Dental caries present. No dental abscesses.     Pharynx: Oropharynx is clear. No pharyngeal swelling or posterior oropharyngeal erythema.     Tonsils: No tonsillar exudate.      Comments: Tenderness to palpation of left lower jaw and approximately area marked; no obvious dental abscess.  Multiple missing and fractured teeth noted. Eyes:     General: No scleral icterus.    Extraocular Movements: Extraocular movements intact.  Pulmonary:     Effort: Pulmonary effort is normal. No respiratory distress.  Musculoskeletal:     Cervical back: Normal range of motion.  Lymphadenopathy:     Cervical: Cervical adenopathy present.  Skin:    General: Skin is warm and dry.     Coloration: Skin is not jaundiced or pale.     Findings: No erythema.  Neurological:     Mental Status: He is alert and oriented to person, place, and time.  Psychiatric:        Behavior: Behavior is cooperative.      UC Treatments / Results  Labs (all labs ordered are listed, but only abnormal results are displayed) Labs Reviewed - No data to  display  EKG   Radiology No results  found.  Procedures Procedures (including critical care time)  Medications Ordered in UC Medications - No data to display  Initial Impression / Assessment and Plan / UC Course  I have reviewed the triage vital signs and the nursing notes.  Pertinent labs & imaging results that were available during my care of the patient were reviewed by me and considered in my medical decision making (see chart for details).   Patient is well-appearing, normotensive, afebrile, not tachycardic, not tachypneic, oxygenating well on room air.    1. Dentalgia Treat with Augmentin twice daily for 7 days Start ibuprofen 800 mg every 8 hours alternating with Tylenol 500 to 1000 mg every 6 hours as needed for pain Dental resource guide given for close follow-up  The patient was given the opportunity to ask questions.  All questions answered to their satisfaction.  The patient is in agreement to this plan.   Final Clinical Impressions(s) / UC Diagnoses   Final diagnoses:  Dentalgia     Discharge Instructions      Start taking the Augmentin twice daily for 7 days to treat for dental infection inside your jaw.  Recommend continuing ibuprofen 800 mg every 8 hours alternating with Tylenol 500 to 1000 mg every 6 hours as needed for pain.  Also recommend cool compresses to the outside of your face.  Follow-up closely with dentist.  Seek care if symptoms do not improve with treatment.   ED Prescriptions     Medication Sig Dispense Auth. Provider   amoxicillin-clavulanate (AUGMENTIN) 875-125 MG tablet Take 1 tablet by mouth 2 (two) times daily for 7 days. 14 tablet Cathlean Marseilles A, NP   ibuprofen (ADVIL) 800 MG tablet Take 1 tablet (800 mg total) by mouth every 8 (eight) hours as needed. Take with food to prevent GI upset 30 tablet Valentino Nose, NP      PDMP not reviewed this encounter.   Valentino Nose, NP 01/13/24 [phone]

## 2024-08-08 NOTE — Progress Notes (Signed)
" °  Subjective Patient ID: Javier Johnston is a 33 y.o. male.  Chief Complaint  Patient presents with   Shoulder Pain    Patient here for left side shoulder pain that started about 2 weeks ago. He states this past Friday it got much worse. He did have some old 800 mg Ibuprofen  and Steroids he took that with little improvement. Patient states he had a bad injury to that shoulder about 15 years ago and every so often it will flare up.     The following information was reviewed by members of the visit team:  Tobacco  Allergies  Meds      HPI Patient with history as noted above.  He injured his shoulder many years ago and has flareups once in a while.  He has not seen orthopedics.  He reports when he moves his shoulder it feels like there is things moving around inside and it pops and groans.  He has not had a recent x-ray.  In review of his old records showing no shoulder x-rays in our system.  He reports his whole shoulder and arm hurts all the way down to the hand.  There is not a streak or line of pain going down.  He gets relief when he puts his arm up over his head. Review of Systems Review of systems is negative except as noted in HPI Objective Physical Exam Well-developed well-nourished male complaining of pain in the shoulder.  There is no apparent focal tenderness I can elicit.  Capillary refill is normal in the hand.  Sensation is normal in the hand pulses normal distally.  Patient reports hand pain also from several fractures in the hand in the past motor strength is equal in both grips and in both arms. Assessment/Plan X-rays reviewed by me read by radiology show no acute disease Patient had asked for Toradol  and some steroids will give him a shot of each and then prescription for p.o. Toradol  followed by p.o. steroids.  I do want him to take both together that may be too much for his stomach.  He will return if he is worse I have put in follow-up with orthopedics for  him. Diagnosis episodic shoulder pain Electronically signed: Deward Gwenn Ates, MD 08/08/2024  4:24 PM   "

## 2024-10-21 ENCOUNTER — Emergency Department (HOSPITAL_COMMUNITY)

## 2024-10-21 ENCOUNTER — Other Ambulatory Visit: Payer: Self-pay

## 2024-10-21 ENCOUNTER — Inpatient Hospital Stay (HOSPITAL_COMMUNITY)
Admission: EM | Admit: 2024-10-21 | Discharge: 2024-10-24 | DRG: 101 | Disposition: A | Discharge status: Home / Self Care | Attending: Internal Medicine | Admitting: Internal Medicine

## 2024-10-21 ENCOUNTER — Encounter (HOSPITAL_COMMUNITY): Payer: Self-pay | Admitting: *Deleted

## 2024-10-21 DIAGNOSIS — E8729 Other acidosis: Secondary | ICD-10-CM | POA: Diagnosis not present

## 2024-10-21 DIAGNOSIS — R739 Hyperglycemia, unspecified: Secondary | ICD-10-CM | POA: Diagnosis not present

## 2024-10-21 DIAGNOSIS — F1290 Cannabis use, unspecified, uncomplicated: Secondary | ICD-10-CM | POA: Diagnosis present

## 2024-10-21 DIAGNOSIS — F1120 Opioid dependence, uncomplicated: Secondary | ICD-10-CM | POA: Diagnosis not present

## 2024-10-21 DIAGNOSIS — E876 Hypokalemia: Secondary | ICD-10-CM | POA: Diagnosis not present

## 2024-10-21 DIAGNOSIS — Z880 Allergy status to penicillin: Secondary | ICD-10-CM

## 2024-10-21 DIAGNOSIS — Z8249 Family history of ischemic heart disease and other diseases of the circulatory system: Secondary | ICD-10-CM

## 2024-10-21 DIAGNOSIS — R569 Unspecified convulsions: Secondary | ICD-10-CM | POA: Diagnosis not present

## 2024-10-21 DIAGNOSIS — I10 Essential (primary) hypertension: Secondary | ICD-10-CM | POA: Diagnosis present

## 2024-10-21 DIAGNOSIS — Z1152 Encounter for screening for COVID-19: Secondary | ICD-10-CM

## 2024-10-21 DIAGNOSIS — E1165 Type 2 diabetes mellitus with hyperglycemia: Secondary | ICD-10-CM | POA: Diagnosis present

## 2024-10-21 DIAGNOSIS — E872 Acidosis, unspecified: Secondary | ICD-10-CM | POA: Diagnosis present

## 2024-10-21 DIAGNOSIS — F1121 Opioid dependence, in remission: Secondary | ICD-10-CM | POA: Diagnosis present

## 2024-10-21 DIAGNOSIS — D72829 Elevated white blood cell count, unspecified: Secondary | ICD-10-CM | POA: Diagnosis present

## 2024-10-21 DIAGNOSIS — R748 Abnormal levels of other serum enzymes: Secondary | ICD-10-CM | POA: Diagnosis present

## 2024-10-21 DIAGNOSIS — Z820 Family history of epilepsy and other diseases of the nervous system: Secondary | ICD-10-CM

## 2024-10-21 DIAGNOSIS — R6510 Systemic inflammatory response syndrome (SIRS) of non-infectious origin without acute organ dysfunction: Secondary | ICD-10-CM | POA: Diagnosis present

## 2024-10-21 DIAGNOSIS — Z87891 Personal history of nicotine dependence: Secondary | ICD-10-CM

## 2024-10-21 DIAGNOSIS — M6282 Rhabdomyolysis: Secondary | ICD-10-CM | POA: Diagnosis present

## 2024-10-21 DIAGNOSIS — Z888 Allergy status to other drugs, medicaments and biological substances status: Secondary | ICD-10-CM

## 2024-10-21 LAB — CBC WITH DIFFERENTIAL/PLATELET
Abs Immature Granulocytes: 0.18 K/uL — ABNORMAL HIGH (ref 0.00–0.07)
Basophils Absolute: 0.1 K/uL (ref 0.0–0.1)
Basophils Relative: 0 %
Eosinophils Absolute: 0.1 K/uL (ref 0.0–0.5)
Eosinophils Relative: 0 %
HCT: 49.7 % (ref 39.0–52.0)
Hemoglobin: 16.2 g/dL (ref 13.0–17.0)
Immature Granulocytes: 1 %
Lymphocytes Relative: 19 %
Lymphs Abs: 3.2 K/uL (ref 0.7–4.0)
MCH: 31.6 pg (ref 26.0–34.0)
MCHC: 32.6 g/dL (ref 30.0–36.0)
MCV: 97.1 fL (ref 80.0–100.0)
Monocytes Absolute: 0.6 K/uL (ref 0.1–1.0)
Monocytes Relative: 4 %
Neutro Abs: 12.7 K/uL — ABNORMAL HIGH (ref 1.7–7.7)
Neutrophils Relative %: 76 %
Platelets: 310 K/uL (ref 150–400)
RBC: 5.12 MIL/uL (ref 4.22–5.81)
RDW: 11.8 % (ref 11.5–15.5)
WBC: 16.7 K/uL — ABNORMAL HIGH (ref 4.0–10.5)
nRBC: 0 % (ref 0.0–0.2)

## 2024-10-21 LAB — URINALYSIS, ROUTINE W REFLEX MICROSCOPIC
Bilirubin Urine: NEGATIVE
Glucose, UA: >=500 mg/dL — AB
Ketones, ur: NEGATIVE mg/dL
Leukocytes,Ua: NEGATIVE
Nitrite: NEGATIVE
Protein, ur: 100 mg/dL — AB
Specific Gravity, Urine: 1.01 (ref 1.005–1.030)
pH: 5 (ref 5.0–8.0)

## 2024-10-21 LAB — BASIC METABOLIC PANEL WITH GFR
Anion gap: 14 (ref 5–15)
BUN: 7 mg/dL (ref 6–20)
CO2: 20 mmol/L — ABNORMAL LOW (ref 22–32)
Calcium: 9 mg/dL (ref 8.9–10.3)
Chloride: 106 mmol/L (ref 98–111)
Creatinine, Ser: 0.93 mg/dL (ref 0.61–1.24)
GFR, Estimated: >60 mL/min
Glucose, Bld: 105 mg/dL — ABNORMAL HIGH (ref 70–99)
Potassium: 3.4 mmol/L — ABNORMAL LOW (ref 3.5–5.1)
Sodium: 141 mmol/L (ref 135–145)

## 2024-10-21 LAB — ETHANOL: Alcohol, Ethyl (B): <15 mg/dL

## 2024-10-21 LAB — BLOOD GAS, VENOUS
Acid-base deficit: 2.2 mmol/L — ABNORMAL HIGH (ref 0.0–2.0)
Bicarbonate: 23.2 mmol/L (ref 20.0–28.0)
Drawn by: 7049
O2 Saturation: 63.4 %
Patient temperature: 35.9
pCO2, Ven: 39 mmHg — ABNORMAL LOW (ref 44–60)
pH, Ven: 7.38 (ref 7.25–7.43)
pO2, Ven: 33 mmHg (ref 32–45)

## 2024-10-21 LAB — COMPREHENSIVE METABOLIC PANEL WITH GFR
ALT: 89 U/L — ABNORMAL HIGH (ref 0–44)
AST: 60 U/L — ABNORMAL HIGH (ref 15–41)
Albumin: 4.4 g/dL (ref 3.5–5.0)
Alkaline Phosphatase: 77 U/L (ref 38–126)
Anion gap: 29 — ABNORMAL HIGH (ref 5–15)
BUN: 7 mg/dL (ref 6–20)
CO2: 8 mmol/L — ABNORMAL LOW (ref 22–32)
Calcium: 9.4 mg/dL (ref 8.9–10.3)
Chloride: 104 mmol/L (ref 98–111)
Creatinine, Ser: 1.27 mg/dL — ABNORMAL HIGH (ref 0.61–1.24)
GFR, Estimated: >60 mL/min
Glucose, Bld: 237 mg/dL — ABNORMAL HIGH (ref 70–99)
Potassium: 3.3 mmol/L — ABNORMAL LOW (ref 3.5–5.1)
Sodium: 141 mmol/L (ref 135–145)
Total Bilirubin: 0.5 mg/dL (ref 0.0–1.2)
Total Protein: 7 g/dL (ref 6.5–8.1)

## 2024-10-21 LAB — URINE DRUG SCREEN
Amphetamines: NEGATIVE
Barbiturates: NEGATIVE
Benzodiazepines: NEGATIVE
Cocaine: NEGATIVE
Fentanyl: NEGATIVE
Methadone Scn, Ur: NEGATIVE
Opiates: NEGATIVE
Tetrahydrocannabinol: POSITIVE — AB

## 2024-10-21 LAB — MAGNESIUM: Magnesium: 2.5 mg/dL — ABNORMAL HIGH (ref 1.7–2.4)

## 2024-10-21 LAB — RESP PANEL BY RT-PCR (RSV, FLU A&B, COVID)  RVPGX2
Influenza A by PCR: NEGATIVE
Influenza B by PCR: NEGATIVE
Resp Syncytial Virus by PCR: NEGATIVE
SARS Coronavirus 2 by RT PCR: NEGATIVE

## 2024-10-21 LAB — LACTIC ACID, PLASMA: Lactic Acid, Venous: 1.7 mmol/L (ref 0.5–1.9)

## 2024-10-21 LAB — CBG MONITORING, ED: Glucose-Capillary: 240 mg/dL — ABNORMAL HIGH (ref 70–99)

## 2024-10-21 LAB — CK: Total CK: 788 U/L — ABNORMAL HIGH (ref 49–397)

## 2024-10-21 MED ORDER — CHLORHEXIDINE GLUCONATE CLOTH 2 % EX PADS
6.0000 | MEDICATED_PAD | Freq: Every day | CUTANEOUS | Status: DC
  Administered 2024-10-21 – 2024-10-24 (×4): 6 via TOPICAL

## 2024-10-21 MED ORDER — ENOXAPARIN SODIUM 40 MG/0.4ML IJ SOSY: 40.0000 mg | PREFILLED_SYRINGE | INTRAMUSCULAR | Status: DC

## 2024-10-21 MED ORDER — ACETAMINOPHEN 325 MG PO TABS: 650.0000 mg | ORAL_TABLET | Freq: Four times a day (QID) | ORAL | Status: DC | PRN

## 2024-10-21 MED ORDER — SODIUM CHLORIDE 0.9 % IV BOLUS
1000.0000 mL | Freq: Once | INTRAVENOUS | Status: AC
  Administered 2024-10-21: 1000 mL via INTRAVENOUS

## 2024-10-21 MED ORDER — ACETAMINOPHEN 650 MG RE SUPP: 650.0000 mg | Freq: Four times a day (QID) | RECTAL | Status: DC | PRN

## 2024-10-21 MED ORDER — ONDANSETRON HCL 4 MG/2ML IJ SOLN
4.0000 mg | Freq: Once | INTRAMUSCULAR | Status: AC
  Administered 2024-10-21: 4 mg via INTRAVENOUS
  Filled 2024-10-21: qty 2

## 2024-10-21 MED ORDER — PROCHLORPERAZINE EDISYLATE 10 MG/2ML IJ SOLN
10.0000 mg | Freq: Four times a day (QID) | INTRAMUSCULAR | Status: DC | PRN
  Administered 2024-10-21 – 2024-10-22 (×2): 10 mg via INTRAVENOUS
  Filled 2024-10-21 (×2): qty 2

## 2024-10-21 MED ORDER — NALOXONE HCL 2 MG/2ML IJ SOSY
PREFILLED_SYRINGE | INTRAMUSCULAR | Status: AC
  Filled 2024-10-21: qty 2

## 2024-10-21 MED ORDER — POTASSIUM CHLORIDE 10 MEQ/100ML IV SOLN
10.0000 meq | INTRAVENOUS | Status: AC
  Administered 2024-10-21 (×2): 10 meq via INTRAVENOUS
  Filled 2024-10-21 (×2): qty 100

## 2024-10-21 MED ORDER — ONDANSETRON HCL 4 MG/2ML IJ SOLN
4.0000 mg | Freq: Four times a day (QID) | INTRAMUSCULAR | Status: DC | PRN
  Administered 2024-10-22: 4 mg via INTRAVENOUS
  Filled 2024-10-21: qty 2

## 2024-10-21 MED ORDER — LACTATED RINGERS IV SOLN: INTRAVENOUS | Status: AC

## 2024-10-21 MED ORDER — ONDANSETRON HCL 4 MG PO TABS: 4.0000 mg | ORAL_TABLET | Freq: Four times a day (QID) | ORAL | Status: DC | PRN

## 2024-10-21 MED ORDER — NALOXONE HCL 2 MG/2ML IJ SOSY
2.0000 mg | PREFILLED_SYRINGE | Freq: Once | INTRAMUSCULAR | Status: AC
  Administered 2024-10-21: 2 mg via INTRAVENOUS

## 2024-10-21 NOTE — Plan of Care (Signed)
" °  Problem: Clinical Measurements: Goal: Ability to maintain clinical measurements within normal limits will improve Outcome: Progressing Goal: Will remain free from infection Outcome: Progressing   Problem: Activity: Goal: Risk for activity intolerance will decrease Outcome: Progressing   Problem: Elimination: Goal: Will not experience complications related to urinary retention Outcome: Progressing   Problem: Pain Managment: Goal: General experience of comfort will improve and/or be controlled Outcome: Progressing   Problem: Safety: Goal: Ability to remain free from injury will improve Outcome: Progressing   Problem: Skin Integrity: Goal: Risk for impaired skin integrity will decrease Outcome: Progressing   "

## 2024-10-21 NOTE — ED Notes (Signed)
 ED Provider at bedside.

## 2024-10-21 NOTE — ED Notes (Signed)
 Pt returned from CT

## 2024-10-21 NOTE — ED Triage Notes (Addendum)
 Pt BIB [facility] for possible overdose, call came out for cardiac arrest but pt was found in the floor kicking and combative, EMS reported that narcan  was given en route. Pt unresponsive on arrival, reported pt's HR would drop in the 40's. Airway patent and RA sats WNL.

## 2024-10-21 NOTE — ED Notes (Signed)
 Patient transported to CT

## 2024-10-21 NOTE — ED Notes (Signed)
 Pt's girlfriend provided some information that pt has been sick with flu, stated pt went to take the dogs out when pt started having seizure like activity. Reported that pt has hx of seizures.

## 2024-10-21 NOTE — ED Provider Notes (Signed)
 " [place] EMERGENCY DEPARTMENT AT [facility] Provider Note   CSN: [id number] Arrival date & time: 10/21/24  1643     Patient presents with: possible overdose   Javier Johnston is a 34 y.o. male.   The wife states that he got up from a nap and was going to walk the dogs but then hit the floor and started shaking like he was having a seizure.  The patient has had a respiratory infection for about a week.  The history is provided by the patient and medical records. No language interpreter was used.  Seizures Seizure activity on arrival: no   Seizure type:  Grand mal Preceding symptoms: no sensation of an aura present   Initial focality:  None Episode characteristics: abnormal movements   Postictal symptoms: confusion   Return to baseline: yes   Severity:  Moderate Timing:  Once      Prior to Admission medications  Medication Sig Start Date End Date Taking? Authorizing Provider  amLODipine  (NORVASC ) 5 MG tablet Take 5 mg by mouth daily.    [provider]  buprenorphine -naloxone  (SUBOXONE ) 8-2 mg SUBL SL tablet Place under the tongue.    [provider]  escitalopram  (LEXAPRO ) 10 MG tablet Take 10 mg by mouth daily.    [provider]  gabapentin  (NEURONTIN ) 100 MG capsule Take 100 mg by mouth 3 (three) times daily.    [provider]  ibuprofen  (ADVIL ) 800 MG tablet Take 1 tablet (800 mg total) by mouth every 8 (eight) hours as needed. Take with food to prevent GI upset 01/13/24   Chandra Harlene LABOR, NP  meloxicam  (MOBIC ) 7.5 MG tablet Take 1 tablet (7.5 mg total) by mouth daily. 02/12/19   Windle Almarie ORN, PA-C  ondansetron  (ZOFRAN -ODT) 4 MG disintegrating tablet Take 1 tablet (4 mg total) by mouth every 8 (eight) hours as needed. 07/13/23   Hildegard Loge, PA-C    Allergies: Penicillins, Prozac [fluoxetine hcl], and Wellbutrin [bupropion]    Review of Systems  Constitutional:  Negative for appetite change and fatigue.  HENT:   Negative for congestion, ear discharge and sinus pressure.   Eyes:  Negative for discharge.  Respiratory:  Negative for cough.   Cardiovascular:  Negative for chest pain.  Gastrointestinal:  Negative for abdominal pain and diarrhea.  Genitourinary:  Negative for frequency and hematuria.  Musculoskeletal:  Negative for back pain.  Skin:  Negative for rash.  Neurological:  Positive for seizures. Negative for headaches.  Psychiatric/Behavioral:  Negative for hallucinations.     Updated Vital Signs BP 111/80   Pulse 81   Temp (!) 96.6 F (35.9 C) (Axillary)   Resp 19   SpO2 94%   Physical Exam Vitals and nursing note reviewed.  Constitutional:      Appearance: He is well-developed.  HENT:     Head: Normocephalic.     Nose: Nose normal.  Eyes:     General: No scleral icterus.    Conjunctiva/sclera: Conjunctivae normal.  Neck:     Thyroid: No thyromegaly.  Cardiovascular:     Rate and Rhythm: Normal rate and regular rhythm.     Heart sounds: No murmur heard.    No friction rub. No gallop.  Pulmonary:     Breath sounds: No stridor. No wheezing or rales.  Chest:     Chest wall: No tenderness.  Abdominal:     General: There is no distension.     Tenderness: There is no abdominal tenderness.  There is no rebound.  Musculoskeletal:        General: Normal range of motion.     Cervical back: Neck supple.  Lymphadenopathy:     Cervical: No cervical adenopathy.  Skin:    Findings: No erythema or rash.  Neurological:     Mental Status: He is alert and oriented to person, place, and time.     Motor: No abnormal muscle tone.     Coordination: Coordination normal.  Psychiatric:        Behavior: Behavior normal.     (all labs ordered are listed, but only abnormal results are displayed) Labs Reviewed  CBC WITH DIFFERENTIAL/PLATELET - Abnormal; Notable for the following components:      Result Value   WBC 16.7 (*)    Neutro Abs 12.7 (*)    Abs Immature Granulocytes 0.18 (*)     All other components within normal limits  COMPREHENSIVE METABOLIC PANEL WITH GFR - Abnormal; Notable for the following components:   Potassium 3.3 (*)    CO2 8 (*)    Glucose, Bld 237 (*)    Creatinine, Ser 1.27 (*)    AST 60 (*)    ALT 89 (*)    Anion gap 29 (*)    All other components within normal limits  URINE DRUG SCREEN - Abnormal; Notable for the following components:   Tetrahydrocannabinol POSITIVE (*)    All other components within normal limits  URINALYSIS, ROUTINE W REFLEX MICROSCOPIC - Abnormal; Notable for the following components:   Color, Urine STRAW (*)    Glucose, UA >=500 (*)    Hgb urine dipstick MODERATE (*)    Protein, ur 100 (*)    Bacteria, UA RARE (*)    All other components within normal limits  CBG MONITORING, ED - Abnormal; Notable for the following components:   Glucose-Capillary 240 (*)    All other components within normal limits  RESP PANEL BY RT-PCR (RSV, FLU A&B, COVID)  RVPGX2  ETHANOL  BASIC METABOLIC PANEL WITH GFR  MAGNESIUM   HEMOGLOBIN A1C  BLOOD GAS, VENOUS    EKG: None  Radiology: CT Head Wo Contrast Result Date: 10/21/2024 CLINICAL DATA:  Possible overdose combative possible seizure EXAM: CT HEAD WITHOUT CONTRAST CT CERVICAL SPINE WITHOUT CONTRAST TECHNIQUE: Multidetector CT imaging of the head and cervical spine was performed following the standard protocol without intravenous contrast. Multiplanar CT image reconstructions of the cervical spine were also generated. RADIATION DOSE REDUCTION: This exam was performed according to the departmental dose-optimization program which includes automated exposure control, adjustment of the mA and/or kV according to patient size and/or use of iterative reconstruction technique. COMPARISON:  CT brain 09/09/2022, MRI 09/13/2012 FINDINGS: CT HEAD FINDINGS Brain: No acute territorial infarction, hemorrhage or intracranial mass. Right parietal linear cortical calcifications are unchanged, thought  possibly to represent venous malformation on prior MRI. No surrounding edema or adverse features. The ventricles are nonenlarged Vascular: No hyperdense vessels. Skull: Normal. Negative for fracture or focal lesion. Sinuses/Orbits: No acute finding. Other: None CT CERVICAL SPINE FINDINGS Alignment: Straightening of the cervical spine. No subluxation. Facet alignment is normal Skull base and vertebrae: No acute fracture. No primary bone lesion or focal pathologic process. Soft tissues and spinal canal: No prevertebral fluid or swelling. No visible canal hematoma. Disc levels:  Within normal limits Upper chest: Negative. Other: None IMPRESSION: 1. No CT evidence for acute intracranial abnormality. 2. Straightening of the cervical spine. No acute osseous abnormality. Electronically Signed   By: Luke  Scott M.D.   On: 10/21/2024 18:27   CT Cervical Spine Wo Contrast Result Date: 10/21/2024 CLINICAL DATA:  Possible overdose combative possible seizure EXAM: CT HEAD WITHOUT CONTRAST CT CERVICAL SPINE WITHOUT CONTRAST TECHNIQUE: Multidetector CT imaging of the head and cervical spine was performed following the standard protocol without intravenous contrast. Multiplanar CT image reconstructions of the cervical spine were also generated. RADIATION DOSE REDUCTION: This exam was performed according to the departmental dose-optimization program which includes automated exposure control, adjustment of the mA and/or kV according to patient size and/or use of iterative reconstruction technique. COMPARISON:  CT brain 09/09/2022, MRI 09/13/2012 FINDINGS: CT HEAD FINDINGS Brain: No acute territorial infarction, hemorrhage or intracranial mass. Right parietal linear cortical calcifications are unchanged, thought possibly to represent venous malformation on prior MRI. No surrounding edema or adverse features. The ventricles are nonenlarged Vascular: No hyperdense vessels. Skull: Normal. Negative for fracture or focal lesion.  Sinuses/Orbits: No acute finding. Other: None CT CERVICAL SPINE FINDINGS Alignment: Straightening of the cervical spine. No subluxation. Facet alignment is normal Skull base and vertebrae: No acute fracture. No primary bone lesion or focal pathologic process. Soft tissues and spinal canal: No prevertebral fluid or swelling. No visible canal hematoma. Disc levels:  Within normal limits Upper chest: Negative. Other: None IMPRESSION: 1. No CT evidence for acute intracranial abnormality. 2. Straightening of the cervical spine. No acute osseous abnormality. Electronically Signed   By: Luke Scott M.D.   On: 10/21/2024 18:27   DG Chest Port 1 View Result Date: 10/21/2024 CLINICAL DATA:  Possible overdose. EXAM: PORTABLE CHEST 1 VIEW COMPARISON:  None Available. FINDINGS: The heart size and mediastinal contours are within normal limits. Both lungs are clear. Multiple well-defined, subcentimeter square-shaped radiopaque foci are seen overlying the region of the gastric body and gastric antrum. The visualized skeletal structures are unremarkable. IMPRESSION: 1. No active cardiopulmonary disease. 2. Findings which may represent multiple small ingested radiopaque foreign bodies overlying the region of the gastric body and gastric antrum. Correlation with the patient's history and physical examination is recommended. Electronically Signed   By: Suzen Dials M.D.   On: 10/21/2024 17:35     Procedures   Medications Ordered in the ED  sodium chloride  0.9 % bolus 1,000 mL (has no administration in time range)  sodium chloride  0.9 % bolus 1,000 mL ( Intravenous Stopped 10/21/24 1754)  naloxone  (NARCAN ) injection 2 mg (2 mg Intravenous Given 10/21/24 1653)  ondansetron  (ZOFRAN ) injection 4 mg (4 mg Intravenous Given 10/21/24 1959)  ondansetron  (ZOFRAN ) injection 4 mg (4 mg Intravenous Given 10/21/24 2033)   CRITICAL CARE Performed by: Fairy Sermon Total critical care time: 45 minutes Critical care time was  exclusive of separately billable procedures and treating other patients. Critical care was necessary to treat or prevent imminent or life-threatening deterioration. Critical care was time spent personally by me on the following activities: development of treatment plan with patient and/or surrogate as well as nursing, discussions with consultants, evaluation of patient's response to treatment, examination of patient, obtaining history from patient or surrogate, ordering and performing treatments and interventions, ordering and review of laboratory studies, ordering and review of radiographic studies, pulse oximetry and re-evaluation of patient's condition.                                  Medical Decision Making Amount and/or Complexity of Data Reviewed Labs: ordered. Radiology: ordered. ECG/medicine tests: ordered.  Risk  Prescription drug management. Decision regarding hospitalization.   New onset seizures and acidosis     Final diagnoses:  None    ED Discharge Orders     None          Suzette Pac, MD 10/23/24 1032  "

## 2024-10-21 NOTE — H&P (Signed)
 " History and Physical    Patient: Javier Johnston DOB: 07/26/1991 DOA: 10/21/2024 DOS: the patient was seen and examined on 10/21/2024 PCP: Whitfield Fallow, MD  Patient coming from: Home  Chief Complaint:  Chief Complaint  Patient presents with   possible overdose   HPI: Javier Johnston is a 34 y.o. male with medical history significant of narcotic dependence in remission who presents to the emergency department due to witnessed seizure-like movements.  Patient endorsed having seizures at home, but most of the history was obtained from wife at bedside.  Per report, patient has been having flulike symptoms for 1 week with cough, chest congestion, runny nose, headache and fever of 101F on Monday (1/12).  Today, after waking up from a nap, he got up to walk his 2 dogs, but fell and hit the floor and started to shake with seizure-like movements and with foaming in the mouth.  EMS was activated, and noted him to be initially combative.  Narcan  was given en route.  He was postictal on arrival and HR was in the 40s, but was not in any acute distress.  ED course In the emergency department, temperature was 96.6 F and other vital signs are within normal range.  Workup in the ED showed normal CBC except for WBC of 16.7.  Urine drug screen was positive for THC.  BMP showed sodium 141, potassium 3.3, chloride 104, bicarb 8, blood glucose 237, BUN 7, creatinine 1.27, AST 60, ALT 89, anion gap 29.  Urinalysis was positive for glycosuria but no ketonuria.  Respiratory panel was negative. CT head showed no acute intraocular abnormality.  CT cervical spine showed no acute osseous abnormality Chest x-ray showed no active cardiopulmonary disease, but showed multiple small ingested radiopaque foreign bodies overlying the region of the gastric body and gastric antrum. Narcan  was given, Zofran  was given due to vomiting, IV hydration was provided.  [facility] was asked to admit patient    Review of  Systems: As mentioned in the history of present illness. All other systems reviewed and are negative. Past Medical History:  Diagnosis Date   Anxiety    Depression    Hypertension    Narcotic dependence, in remission [facility])    were treated ibnpatent Dec-Jan 2013   Seizure (HCC)    had 1 seizure approx 2 months ago.   Past Surgical History:  Procedure Laterality Date   APPENDECTOMY     67   Social History:  reports that he quit smoking about 4 years ago. His smoking use included cigarettes. He started smoking about 8 years ago. He has a 2.2 pack-year smoking history. He has quit using smokeless tobacco. He reports that he does not currently use alcohol. He reports that he does not currently use drugs after having used the following drugs: Marijuana.  Allergies[1]  Family History  Problem Relation Age of Onset   Coronary artery disease Father 12    Prior to Admission medications  Medication Sig Start Date End Date Taking? Authorizing Provider  amLODipine  (NORVASC ) 5 MG tablet Take 5 mg by mouth daily.    [provider]  buprenorphine -naloxone  (SUBOXONE ) 8-2 mg SUBL SL tablet Place under the tongue.    [provider]  escitalopram  (LEXAPRO ) 10 MG tablet Take 10 mg by mouth daily.    [provider]  gabapentin  (NEURONTIN ) 100 MG capsule Take 100 mg by mouth 3 (three) times daily.    [provider]  ibuprofen  (ADVIL ) 800 MG tablet  Take 1 tablet (800 mg total) by mouth every 8 (eight) hours as needed. Take with food to prevent GI upset 01/13/24   Chandra Harlene LABOR, NP  meloxicam  (MOBIC ) 7.5 MG tablet Take 1 tablet (7.5 mg total) by mouth daily. 02/12/19   Windle Almarie ORN, PA-C  ondansetron  (ZOFRAN -ODT) 4 MG disintegrating tablet Take 1 tablet (4 mg total) by mouth every 8 (eight) hours as needed. 07/13/23   Hildegard Loge, PA-C    Physical Exam: Vitals:   10/21/24 1730 10/21/24 1736 10/21/24 1745 10/21/24 1845  BP: 121/89  120/88 111/80  Pulse:  78  81   Resp: 18  20 19   Temp:  (!) 96.6 F (35.9 C)    TempSrc:  Axillary    SpO2: 98%  94%    General: Ill appearing.  Awake and alert and oriented x3. Not in any acute distress.  HEENT: NCAT.  PERRLA. EOMI. Sclerae anicteric.  Moist mucosal membranes. Neck: Neck supple without lymphadenopathy. No carotid bruits. No masses palpated.  Cardiovascular: Regular rate with normal S1-S2 sounds. No murmurs, rubs or gallops auscultated. No JVD.  Respiratory: Clear breath sounds.  No accessory muscle use. Abdomen: Soft, nontender, nondistended. Active bowel sounds. No masses or hepatosplenomegaly  Skin: No rashes, lesions, or ulcerations.  Dry, warm to touch. Musculoskeletal:  2+ dorsalis pedis and radial pulses. Good ROM.  No contractures  Psychiatric: Intact judgment and insight.  Mood appropriate to current condition. Neurologic: No focal neurological deficits. Strength is 5/5 x 4.  CN II - XII grossly intact.  Assessment and Plan: Witnessed seizure-like movements Patient presented with witnessed seizure-like movements at home (he has no history of seizures) Only episode of prior seizure was about 10 years ago and this was thought to be due to Wellbutrin No known cause of seizure at this time Continue telemetry EEG in the morning MRI of brain in the morning  High anion gap metabolic acidosis possibly secondary to above - Resolved Bicarb was 8, anion gap was 29 IV hydration was provided with improvement in bicarb to 20 and gap is closed at 14  Elevated CK level CK 788, this is possibly due to muscle breakdown during the seizure episode Continue IV hydration  Hypokalemia K+ 3.3 > 3.4, this will be replenished  Hyperglycemia possibly reactive Patient has no history of diabetes Hemoglobin A1c will be checked  Leukocytosis possibly reactive WBC 16.7, continue to monitor WBC with morning labs  Ingested foreign bodies per chest x-ray Chest x-ray showed multiple small ingested  radiopaque foreign bodies overlying the region of the gastric body and gastric antrum. Patient denies ingestion of any foreign bodies  Narcotic dependence in remission Continue Suboxone   Marijuana use Urine drug screen was positive for THC Patient was counseled on marijuana use cessation   Advance Care Planning: Full code  Consults: None  Family Communication: Family at bedside  Severity of Illness: The appropriate patient status for this patient is OBSERVATION. Observation status is judged to be reasonable and necessary in order to provide the required intensity of service to ensure the patient's safety. The patient's presenting symptoms, physical exam findings, and initial radiographic and laboratory data in the context of their medical condition is felt to place them at decreased risk for further clinical deterioration. Furthermore, it is anticipated that the patient will be medically stable for discharge from the hospital within 2 midnights of admission.   Author: Ravon Mcilhenny, DO 10/21/2024 8:46 PM  For on call review [email or web address].      [  1]  Allergies Allergen Reactions   Penicillins Other (See Comments)    Unknown Childhood reaction   Prozac [Fluoxetine Hcl] Other (See Comments)    made me crazy   Wellbutrin [Bupropion]    "

## 2024-10-21 NOTE — ED Notes (Signed)
 Girlfriend has pt's black in color earrings and silver toned chain necklace.

## 2024-10-22 ENCOUNTER — Observation Stay (HOSPITAL_COMMUNITY)

## 2024-10-22 ENCOUNTER — Other Ambulatory Visit: Payer: Self-pay

## 2024-10-22 DIAGNOSIS — F1290 Cannabis use, unspecified, uncomplicated: Secondary | ICD-10-CM | POA: Diagnosis present

## 2024-10-22 DIAGNOSIS — D72829 Elevated white blood cell count, unspecified: Secondary | ICD-10-CM | POA: Diagnosis present

## 2024-10-22 DIAGNOSIS — I10 Essential (primary) hypertension: Secondary | ICD-10-CM | POA: Diagnosis present

## 2024-10-22 DIAGNOSIS — Z87891 Personal history of nicotine dependence: Secondary | ICD-10-CM | POA: Diagnosis not present

## 2024-10-22 DIAGNOSIS — R569 Unspecified convulsions: Secondary | ICD-10-CM | POA: Diagnosis present

## 2024-10-22 DIAGNOSIS — E1165 Type 2 diabetes mellitus with hyperglycemia: Secondary | ICD-10-CM | POA: Diagnosis present

## 2024-10-22 DIAGNOSIS — Z888 Allergy status to other drugs, medicaments and biological substances status: Secondary | ICD-10-CM | POA: Diagnosis not present

## 2024-10-22 DIAGNOSIS — Z1152 Encounter for screening for COVID-19: Secondary | ICD-10-CM | POA: Diagnosis not present

## 2024-10-22 DIAGNOSIS — M6282 Rhabdomyolysis: Secondary | ICD-10-CM | POA: Diagnosis present

## 2024-10-22 DIAGNOSIS — Z820 Family history of epilepsy and other diseases of the nervous system: Secondary | ICD-10-CM | POA: Diagnosis not present

## 2024-10-22 DIAGNOSIS — F1121 Opioid dependence, in remission: Secondary | ICD-10-CM | POA: Diagnosis present

## 2024-10-22 DIAGNOSIS — Z880 Allergy status to penicillin: Secondary | ICD-10-CM | POA: Diagnosis not present

## 2024-10-22 DIAGNOSIS — R6510 Systemic inflammatory response syndrome (SIRS) of non-infectious origin without acute organ dysfunction: Secondary | ICD-10-CM | POA: Diagnosis present

## 2024-10-22 DIAGNOSIS — R748 Abnormal levels of other serum enzymes: Secondary | ICD-10-CM | POA: Diagnosis present

## 2024-10-22 DIAGNOSIS — E872 Acidosis, unspecified: Secondary | ICD-10-CM | POA: Diagnosis present

## 2024-10-22 DIAGNOSIS — E876 Hypokalemia: Secondary | ICD-10-CM | POA: Diagnosis present

## 2024-10-22 DIAGNOSIS — Z8249 Family history of ischemic heart disease and other diseases of the circulatory system: Secondary | ICD-10-CM | POA: Diagnosis not present

## 2024-10-22 LAB — CBC
HCT: 37.6 % — ABNORMAL LOW (ref 39.0–52.0)
Hemoglobin: 13.4 g/dL (ref 13.0–17.0)
MCH: 32.3 pg (ref 26.0–34.0)
MCHC: 35.6 g/dL (ref 30.0–36.0)
MCV: 90.6 fL (ref 80.0–100.0)
Platelets: 205 K/uL (ref 150–400)
RBC: 4.15 MIL/uL — ABNORMAL LOW (ref 4.22–5.81)
RDW: 12.3 % (ref 11.5–15.5)
WBC: 16.1 K/uL — ABNORMAL HIGH (ref 4.0–10.5)
nRBC: 0 % (ref 0.0–0.2)

## 2024-10-22 LAB — PROCALCITONIN: Procalcitonin: 0.16 ng/mL

## 2024-10-22 LAB — HEMOGLOBIN A1C
Hgb A1c MFr Bld: 5.2 % (ref 4.8–5.6)
Mean Plasma Glucose: 102.54 mg/dL

## 2024-10-22 LAB — COMPREHENSIVE METABOLIC PANEL WITH GFR
ALT: 82 U/L — ABNORMAL HIGH (ref 0–44)
AST: 94 U/L — ABNORMAL HIGH (ref 15–41)
Albumin: 4.2 g/dL (ref 3.5–5.0)
Alkaline Phosphatase: 61 U/L (ref 38–126)
Anion gap: 17 — ABNORMAL HIGH (ref 5–15)
BUN: 8 mg/dL (ref 6–20)
CO2: 19 mmol/L — ABNORMAL LOW (ref 22–32)
Calcium: 9 mg/dL (ref 8.9–10.3)
Chloride: 107 mmol/L (ref 98–111)
Creatinine, Ser: 1.21 mg/dL (ref 0.61–1.24)
GFR, Estimated: >60 mL/min
Glucose, Bld: 112 mg/dL — ABNORMAL HIGH (ref 70–99)
Potassium: 3.8 mmol/L (ref 3.5–5.1)
Sodium: 142 mmol/L (ref 135–145)
Total Bilirubin: 0.8 mg/dL (ref 0.0–1.2)
Total Protein: 6.2 g/dL — ABNORMAL LOW (ref 6.5–8.1)

## 2024-10-22 LAB — MRSA NEXT GEN BY PCR, NASAL: MRSA by PCR Next Gen: NOT DETECTED

## 2024-10-22 LAB — SEDIMENTATION RATE: Sed Rate: 4 mm/h (ref 0–15)

## 2024-10-22 LAB — LACTIC ACID, PLASMA
Lactic Acid, Venous: 3.2 mmol/L (ref 0.5–1.9)
Lactic Acid, Venous: 2.8 mmol/L (ref 0.5–1.9)
Lactic Acid, Venous: 3.1 mmol/L (ref 0.5–1.9)
Lactic Acid, Venous: 1.7 mmol/L (ref 0.5–1.9)

## 2024-10-22 LAB — GLUCOSE, CAPILLARY: Glucose-Capillary: 117 mg/dL — ABNORMAL HIGH (ref 70–99)

## 2024-10-22 LAB — HIV ANTIBODY (ROUTINE TESTING W REFLEX): HIV Screen 4th Generation wRfx: NONREACTIVE

## 2024-10-22 LAB — LIPASE, BLOOD: Lipase: 11 U/L (ref 11–51)

## 2024-10-22 LAB — PHOSPHORUS: Phosphorus: 2.3 mg/dL — ABNORMAL LOW (ref 2.5–4.6)

## 2024-10-22 LAB — MAGNESIUM: Magnesium: 2.1 mg/dL (ref 1.7–2.4)

## 2024-10-22 MED ORDER — SODIUM CHLORIDE 0.9 % IV SOLN
2.0000 g | Freq: Three times a day (TID) | INTRAVENOUS | Status: DC
  Administered 2024-10-22 – 2024-10-24 (×6): 2 g via INTRAVENOUS
  Filled 2024-10-22 (×7): qty 12.5

## 2024-10-22 MED ORDER — METHOCARBAMOL 1000 MG/10ML IJ SOLN
1000.0000 mg | Freq: Once | INTRAMUSCULAR | Status: AC
  Administered 2024-10-22: 1000 mg via INTRAVENOUS
  Filled 2024-10-22: qty 10

## 2024-10-22 MED ORDER — GABAPENTIN 100 MG PO CAPS: 100.0000 mg | ORAL_CAPSULE | Freq: Three times a day (TID) | ORAL | Status: DC

## 2024-10-22 MED ORDER — LORAZEPAM 2 MG/ML IJ SOLN: 2.0000 mg | INTRAMUSCULAR | Status: DC | PRN

## 2024-10-22 MED ORDER — LEVETIRACETAM (KEPPRA) 500 MG/5 ML ADULT IV PUSH
500.0000 mg | Freq: Two times a day (BID) | INTRAVENOUS | Status: DC
  Administered 2024-10-22 – 2024-10-24 (×5): 500 mg via INTRAVENOUS
  Filled 2024-10-22 (×5): qty 5

## 2024-10-22 MED ORDER — NOREPINEPHRINE 4 MG/250ML-% IV SOLN
INTRAVENOUS | Status: AC
  Administered 2024-10-22: 2 ug/min via INTRAVENOUS
  Filled 2024-10-22: qty 250

## 2024-10-22 MED ORDER — GABAPENTIN 100 MG PO CAPS
100.0000 mg | ORAL_CAPSULE | Freq: Three times a day (TID) | ORAL | Status: DC
  Administered 2024-10-22 – 2024-10-24 (×8): 100 mg via ORAL
  Filled 2024-10-22 (×8): qty 1

## 2024-10-22 MED ORDER — LACTATED RINGERS IV BOLUS
500.0000 mL | Freq: Once | INTRAVENOUS | Status: AC
  Administered 2024-10-22: 500 mL via INTRAVENOUS

## 2024-10-22 MED ORDER — NOREPINEPHRINE 4 MG/250ML-% IV SOLN: 0.0000 ug/min | INTRAVENOUS | Status: DC

## 2024-10-22 MED ORDER — NAPROXEN 250 MG PO TABS
500.0000 mg | ORAL_TABLET | Freq: Three times a day (TID) | ORAL | Status: DC
  Administered 2024-10-22 (×2): 500 mg via ORAL
  Filled 2024-10-22 (×4): qty 2

## 2024-10-22 MED ORDER — SODIUM CHLORIDE 0.9 % IV SOLN
250.0000 mL | INTRAVENOUS | Status: AC
  Administered 2024-10-22: 250 mL via INTRAVENOUS

## 2024-10-22 MED ORDER — LACTATED RINGERS IV BOLUS
1000.0000 mL | Freq: Once | INTRAVENOUS | Status: AC
  Administered 2024-10-22: 1000 mL via INTRAVENOUS

## 2024-10-22 MED ORDER — IOHEXOL 300 MG/ML  SOLN: 100.0000 mL | Freq: Once | INTRAMUSCULAR | Status: DC | PRN

## 2024-10-22 MED ORDER — NAPROXEN 250 MG PO TABS: 500.0000 mg | ORAL_TABLET | Freq: Three times a day (TID) | ORAL | Status: DC

## 2024-10-22 MED ORDER — IOHEXOL 300 MG/ML  SOLN
100.0000 mL | Freq: Once | INTRAMUSCULAR | Status: AC | PRN
  Administered 2024-10-22: 100 mL via INTRAVENOUS

## 2024-10-22 MED ORDER — LORAZEPAM 2 MG/ML IJ SOLN
1.0000 mg | Freq: Once | INTRAMUSCULAR | Status: AC
  Administered 2024-10-22: 1 mg via INTRAVENOUS
  Filled 2024-10-22: qty 1

## 2024-10-22 MED ORDER — BUPRENORPHINE HCL-NALOXONE HCL 8-2 MG SL SUBL
1.0000 | SUBLINGUAL_TABLET | Freq: Every day | SUBLINGUAL | Status: DC
  Administered 2024-10-22 – 2024-10-23 (×2): 1 via SUBLINGUAL
  Filled 2024-10-22 (×2): qty 1

## 2024-10-22 MED ORDER — LACTATED RINGERS IV SOLN: INTRAVENOUS | Status: DC

## 2024-10-22 NOTE — Progress Notes (Signed)
 " PROGRESS NOTE    Javier Johnston  FMW:[id number] DOB: [birth year] DOA: 10/21/2024 PCP: Whitfield Fallow, MD   Brief Narrative:    Javier Johnston is a 34 y.o. male with medical history significant of narcotic dependence in remission who presents to the emergency department due to witnessed seizure-like movements.  Patient endorsed having seizures at home, but most of the history was obtained from wife at bedside.  Per report, patient has been having flulike symptoms for 1 week with cough, chest congestion, runny nose, headache and fever of 101F on Monday (1/12).  Today, after waking up from a nap, he got up to walk his 2 dogs, but fell and hit the floor and started to shake with seizure-like movements and with foaming in the mouth.  EMS was activated, and noted him to be initially combative.  Narcan  was given en route.  He was postictal on arrival and HR was in the 40s, but was not in any acute distress.  Patient was admitted for concerns of seizure and also appears to have sepsis, POA.  He has been started on IV fluids along with cefepime  empirically.  He has also been started on Keppra  IV twice daily.  Brain MRI with no acute findings and EEG ordered and pending.  Assessment & Plan:   Principal Problem:   Seizures (HCC) Active Problems:   Seizure (HCC)  Assessment and Plan:  Witnessed seizure-like movements Patient presented with witnessed seizure-like movements at home (he has no history of seizures) Only episode of prior seizure was about 10 years ago and this was thought to be due to Wellbutrin No known cause of seizure at this time Continue telemetry EEG ordered and pending MRI of brain with no acute findings   SIRS with lactic acidosis Concerns for sepsis noted, but no overt signs of infection   Elevated CK level CK 788, this is possibly due to muscle breakdown during the seizure episode Continue IV hydration Recheck in a.m.    Leukocytosis possibly reactive Continue  monitoring or could be indicative of infection   Ingested foreign bodies per chest x-ray Chest x-ray showed multiple small ingested radiopaque foreign bodies overlying the region of the gastric body and gastric antrum. Patient denies ingestion of any foreign bodies   Narcotic dependence in remission Continue Suboxone    Marijuana use Urine drug screen was positive for Javier Johnston Patient was counseled on marijuana use cessation   DVT prophylaxis: SCDs Code Status: Full Family Communication: Wife and mother at bedside 1/17 Disposition Plan:  Status is: Inpatient Remains inpatient appropriate because: Need for IV medications   Consultants:  None  Procedures:  None  Antimicrobials:  Anti-infectives (From admission, onward)    Start     Dose/Rate Route Frequency Ordered Stop   10/22/24 1400  ceFEPIme  (MAXIPIME ) 2 g in sodium chloride  0.9 % 100 mL IVPB        2 g 200 mL/hr over 30 Minutes Intravenous Every 8 hours 10/22/24 1304         Subjective: Patient seen and evaluated today and appears to be mostly somnolent and unresponsive.  Objective: Vitals:   10/22/24 1445 10/22/24 1446 10/22/24 1500 10/22/24 1515  BP: (!) 70/38 108/73 (!) 120/56 (!) 82/52  Pulse: (!) 48 67 (!) 54 79  Resp: 12 15 17 17   Temp:   98 F (36.7 C)   TempSrc:   Oral   SpO2: 98% 98% 91% 96%  Weight:      Height:  Intake/Output Summary (Last 24 hours) at 10/22/2024 1550 Last data filed at 10/22/2024 1445 Gross per 24 hour  Intake 3265.15 ml  Output 200 ml  Net 3065.15 ml   Filed Weights   10/21/24 2100  Weight: 50.8 kg    Examination:  General exam: Appears somnolent but arousable Respiratory system: Clear to auscultation. Respiratory effort normal. Cardiovascular system: S1 & S2 heard, RRR.  Gastrointestinal system: Abdomen is soft Central nervous system: Alert and awake Extremities: No edema Skin: No significant lesions noted Psychiatry: Flat affect.    Data Reviewed: I have  personally reviewed following labs and imaging studies  CBC: Recent Labs  Lab 10/21/24 1715 10/22/24 0559  WBC 16.7* 16.1*  NEUTROABS 12.7*  --   HGB 16.2 13.4  HCT 49.7 37.6*  MCV 97.1 90.6  PLT 310 205   Basic Metabolic Panel: Recent Labs  Lab 10/21/24 1715 10/21/24 2112 10/22/24 0559  NA 141 141 142  K 3.3* 3.4* 3.8  CL 104 106 107  CO2 8* 20* 19*  GLUCOSE 237* 105* 112*  BUN 7 7 8   CREATININE 1.27* 0.93 1.21  CALCIUM 9.4 9.0 9.0  MG  --  2.5* 2.1  PHOS  --   --  2.3*   GFR: Estimated Creatinine Clearance: 61.8 mL/min (by C-G formula based on SCr of 1.21 mg/dL). Liver Function Tests: Recent Labs  Lab 10/21/24 1715 10/22/24 0559  AST 60* 94*  ALT 89* 82*  ALKPHOS 77 61  BILITOT 0.5 0.8  PROT 7.0 6.2*  ALBUMIN 4.4 4.2   Recent Labs  Lab 10/22/24 0559  LIPASE 11   No results for input(s): AMMONIA in the last 168 hours. Coagulation Profile: No results for input(s): INR, PROTIME in the last 168 hours. Cardiac Enzymes: Recent Labs  Lab 10/21/24 2112  CKTOTAL 788*   BNP (last 3 results) No results for input(s): PROBNP in the last 8760 hours. HbA1C: Recent Labs    10/21/24 2112  HGBA1C 5.2   CBG: Recent Labs  Lab 10/21/24 1700 10/22/24 0043  GLUCAP 240* 117*   Lipid Profile: No results for input(s): CHOL, HDL, LDLCALC, TRIG, CHOLHDL, LDLDIRECT in the last 72 hours. Thyroid Function Tests: No results for input(s): TSH, T4TOTAL, FREET4, T3FREE, THYROIDAB in the last 72 hours. Anemia Panel: No results for input(s): VITAMINB12, FOLATE, FERRITIN, TIBC, IRON, RETICCTPCT in the last 72 hours. Sepsis Labs: Recent Labs  Lab 10/21/24 2112 10/22/24 0103 10/22/24 0559 10/22/24 0809 10/22/24 1341  PROCALCITON  --   --   --   --  0.16  LATICACIDVEN 1.7 3.2* 2.8* 3.1*  --     Recent Results (from the past 240 hours)  Resp panel by RT-PCR (RSV, Flu A&B, Covid) Anterior Nasal Swab     Status: None    Collection Time: 10/21/24  5:42 PM   Specimen: Anterior Nasal Swab  Result Value Ref Range Status   SARS Coronavirus 2 by RT PCR NEGATIVE NEGATIVE Final    Comment: (NOTE) SARS-CoV-2 target nucleic acids are NOT DETECTED.  The SARS-CoV-2 RNA is generally detectable in upper respiratory specimens during the acute phase of infection. The lowest concentration of SARS-CoV-2 viral copies this assay can detect is 138 copies/mL. A negative result does not preclude SARS-Cov-2 infection and should not be used as the sole basis for treatment or other patient management decisions. A negative result may occur with  improper specimen collection/handling, submission of specimen other than nasopharyngeal swab, presence of viral mutation(s) within the areas targeted by  this assay, and inadequate number of viral copies(<138 copies/mL). A negative result must be combined with clinical observations, patient history, and epidemiological information. The expected result is Negative.  Fact Sheet for Patients:  bloggercourse.com  Fact Sheet for Healthcare Providers:  seriousbroker.it  This test is no t yet approved or cleared by the [place]  FDA and  has been authorized for detection and/or diagnosis of SARS-CoV-2 by FDA under an Emergency Use Authorization (EUA). This EUA will remain  in effect (meaning this test can be used) for the duration of the COVID-19 declaration under Section 564(b)(1) of the Act, 21 U.S.C.section 360bbb-3(b)(1), unless the authorization is terminated  or revoked sooner.       Influenza A by PCR NEGATIVE NEGATIVE Final   Influenza B by PCR NEGATIVE NEGATIVE Final    Comment: (NOTE) The Xpert Xpress SARS-CoV-2/FLU/RSV plus assay is intended as an aid in the diagnosis of influenza from Nasopharyngeal swab specimens and should not be used as a sole basis for treatment. Nasal washings and aspirates are unacceptable for  Xpert Xpress SARS-CoV-2/FLU/RSV testing.  Fact Sheet for Patients: bloggercourse.com  Fact Sheet for Healthcare Providers: seriousbroker.it  This test is not yet approved or cleared by the [place]  FDA and has been authorized for detection and/or diagnosis of SARS-CoV-2 by FDA under an Emergency Use Authorization (EUA). This EUA will remain in effect (meaning this test can be used) for the duration of the COVID-19 declaration under Section 564(b)(1) of the Act, 21 U.S.C. section 360bbb-3(b)(1), unless the authorization is terminated or revoked.     Resp Syncytial Virus by PCR NEGATIVE NEGATIVE Final    Comment: (NOTE) Fact Sheet for Patients: bloggercourse.com  Fact Sheet for Healthcare Providers: seriousbroker.it  This test is not yet approved or cleared by the [place]  FDA and has been authorized for detection and/or diagnosis of SARS-CoV-2 by FDA under an Emergency Use Authorization (EUA). This EUA will remain in effect (meaning this test can be used) for the duration of the COVID-19 declaration under Section 564(b)(1) of the Act, 21 U.S.C. section 360bbb-3(b)(1), unless the authorization is terminated or revoked.  Performed at [facility], [address] [place], [place] [address]   MRSA Next Gen by PCR, Nasal     Status: None   Collection Time: 10/21/24  9:40 PM   Specimen: Nasal Mucosa; Nasal Swab  Result Value Ref Range Status   MRSA by PCR Next Gen NOT DETECTED NOT DETECTED Final    Comment: (NOTE) The GeneXpert MRSA Assay (FDA approved for NASAL specimens only), is one component of a comprehensive MRSA colonization surveillance program. It is not intended to diagnose MRSA infection nor to guide or monitor treatment for MRSA infections. Test performance is not FDA approved in patients less than 51 years old. Performed at [facility], [address],  [place], [place] [address]   Culture, blood (Routine X 2) w Reflex to ID Panel     Status: None (Preliminary result)   Collection Time: 10/22/24  1:41 PM   Specimen: BLOOD LEFT HAND  Result Value Ref Range Status   Specimen Description   Final    BLOOD LEFT HAND BOTTLES DRAWN AEROBIC AND ANAEROBIC   Special Requests   Final    Blood Culture results may not be optimal due to an inadequate volume of blood received in culture bottles Performed at [facility], [address], [place], [place] [address]    Culture PENDING  Incomplete   Report Status PENDING  Incomplete  Culture, blood (Routine X 2) w Reflex to ID Panel     Status: None (Preliminary result)   Collection Time: 10/22/24  1:41 PM   Specimen: BLOOD RIGHT HAND  Result Value Ref Range Status   Specimen Description   Final    BLOOD RIGHT HAND BOTTLES DRAWN AEROBIC AND ANAEROBIC   Special Requests   Final    Blood Culture results may not be optimal due to an inadequate volume of blood received in culture bottles Performed at [facility], [address], [place], [place] [address]    Culture PENDING  Incomplete   Report Status PENDING  Incomplete         Radiology Studies: CT TEMPORAL BONES WO CONTRAST Result Date: 10/22/2024 EXAM: CT TEMPORAL BONES WITHOUT CONTRAST 10/22/2024 02:51:00 PM TECHNIQUE: CT of the temporal bones was performed without the administration of intravenous contrast. Multiplanar reformatted images are provided for review. Automated exposure control, iterative reconstruction, and/or weight based adjustment of the mA/kV was utilized to reduce the radiation dose to as low as reasonably achievable. COMPARISON: MR head without contrast 10/23/2023 and MR head without and with contrast 09/13/2024. A CT head without contrast 10/22/2023. CLINICAL HISTORY: Mastoiditis. FINDINGS: RIGHT TEMPORAL BONE: EXTERNAL AUDITORY CANAL: Minimal debris, likely cerumen is present within the right external auditory canal. No bony erosion.  Scutum is intact. MIDDLE EAR CAVITY: Clear. Ossicular chain is intact. MASTOID AIR CELLS: Fluid within chronically coalescent air cells in the inferior right mastoid is stable. No inflammatory changes are present adjacent to the mastoid. INNER EAR: The cochlea and vestibule are unremarkable. Normal mineralization of the otic capsule. Normal semicircular canals. The vestibular aqueduct is not dilated. INTERNAL AUDITORY CANAL: Unremarkable. Normal bony canal of the facial nerve. LEFT TEMPORAL BONE: EXTERNAL AUDITORY CANAL: Debris in the left external auditory canal likely represents cerumen. No bony erosion. Scutum is intact. MIDDLE EAR CAVITY: Clear. Ossicular chain is intact. MASTOID AIR CELLS: Clear. INNER EAR: The cochlea and vestibule are unremarkable. Normal mineralization of the otic capsule. Normal semicircular canals. The vestibular aqueduct is not dilated. INTERNAL AUDITORY CANAL: Unremarkable. Normal bony canal of the facial nerve. VASCULAR: A high riding right jugular bulb is noted. The sigmoid plate is intact. Normal carotid canals. BRAIN: Unremarkable. ORBITS: No acute abnormality. SINUSES: Minimal mucosal thickening is present in the left maxillary sinus. IMPRESSION: 1. Stable fluid within chronically opacified air cells in the inferior right mastoid without adjacent inflammatory changes. Electronically signed by: Lonni Necessary MD 10/22/2024 03:22 PM EST RP Workstation: [id number]   MR BRAIN WO CONTRAST Result Date: 10/22/2024 EXAM: MRI BRAIN WITHOUT CONTRAST 10/22/2024 10:59:05 AM TECHNIQUE: Multiplanar multisequence MRI of the head/brain was performed without the administration of intravenous contrast. COMPARISON: MR Head without then with IV Contrast 09/13/2012; 11/27/2003. CT head without contrast 08/11/2025. CLINICAL HISTORY: Neuro deficit, acute, stroke suspected; Seizure, new-onset, no history of trauma. FINDINGS: BRAIN AND VENTRICLES: No acute infarct. No intracranial hemorrhage. No  mass. No midline shift. No hydrocephalus. The sella is unremarkable. Normal flow voids. Dedicated imaging of the temporal lobes demonstrate symmetric size and signal of the hippocampal structures. ORBITS: No significant abnormality. SINUSES AND MASTOIDS: Right mastoid effusion is new. No obstructing nasopharyngeal lesion is present. BONES AND SOFT TISSUES: Normal marrow signal. No soft tissue abnormality. IMPRESSION: 1. No acute intracranial abnormality. 2. New right mastoid effusion without an obstructing nasopharyngeal lesion. Electronically signed by: Lonni Necessary MD 10/22/2024 12:36 PM EST RP Workstation: [id number]   CT ABDOMEN PELVIS W CONTRAST Result Date: 10/22/2024 EXAM:  CT ABDOMEN AND PELVIS WITHOUT CONTRAST 10/22/2024 03:55:43 AM TECHNIQUE: CT of the abdomen and pelvis was performed without the administration of intravenous contrast. Multiplanar reformatted images are provided for review. Automated exposure control, iterative reconstruction, and/or weight-based adjustment of the mA/kV was utilized to reduce the radiation dose to as low as reasonably achievable. COMPARISON: CT of the abdomen and pelvis dated 06/24/2007 and AP radiograph of the chest dated 10/21/2024. CLINICAL HISTORY: Abdominal pain, acute, nonlocalized. FINDINGS: LIMITATIONS: The study is limited by patient motion and the absence of intravenous contrast. LOWER CHEST: No acute abnormality. LIVER: The liver is unremarkable. GALLBLADDER AND BILE DUCTS: The gallbladder is mildly distended. No biliary ductal dilatation. SPLEEN: No acute abnormality. PANCREAS: No acute abnormality. ADRENAL GLANDS: No acute abnormality. KIDNEYS, URETERS AND BLADDER: No stones in the kidneys or ureters. No hydronephrosis. No perinephric or periureteral stranding. Urinary bladder is unremarkable. GI AND BOWEL: Stomach demonstrates no acute abnormality. The radiopaque opacities previously noted within the stomach now appear to be within the cecum and  appendix or small bowel. There is no bowel obstruction. PERITONEUM AND RETROPERITONEUM: No ascites. No free air. VASCULATURE: Aorta is normal in caliber. LYMPH NODES: No lymphadenopathy. REPRODUCTIVE ORGANS: No acute abnormality. BONES AND SOFT TISSUES: No acute osseous abnormality. No focal soft tissue abnormality. IMPRESSION: 1. Limited noncontrast exam degraded by motion; no acute abnormality identified in the abdomen or pelvis. 2. Mildly distended gallbladder, nonspecific. 3. Previously noted radiopaque material has migrated into the bowel (cecum/appendix/small bowel). Electronically signed by: Evalene Coho MD 10/22/2024 04:07 AM EST RP Workstation: [id number]   CT Head Wo Contrast Result Date: 10/21/2024 CLINICAL DATA:  Possible overdose combative possible seizure EXAM: CT HEAD WITHOUT CONTRAST CT CERVICAL SPINE WITHOUT CONTRAST TECHNIQUE: Multidetector CT imaging of the head and cervical spine was performed following the standard protocol without intravenous contrast. Multiplanar CT image reconstructions of the cervical spine were also generated. RADIATION DOSE REDUCTION: This exam was performed according to the departmental dose-optimization program which includes automated exposure control, adjustment of the mA and/or kV according to patient size and/or use of iterative reconstruction technique. COMPARISON:  CT brain 09/09/2022, MRI 09/13/2012 FINDINGS: CT HEAD FINDINGS Brain: No acute territorial infarction, hemorrhage or intracranial mass. Right parietal linear cortical calcifications are unchanged, thought possibly to represent venous malformation on prior MRI. No surrounding edema or adverse features. The ventricles are nonenlarged Vascular: No hyperdense vessels. Skull: Normal. Negative for fracture or focal lesion. Sinuses/Orbits: No acute finding. Other: None CT CERVICAL SPINE FINDINGS Alignment: Straightening of the cervical spine. No subluxation. Facet alignment is normal Skull base and  vertebrae: No acute fracture. No primary bone lesion or focal pathologic process. Soft tissues and spinal canal: No prevertebral fluid or swelling. No visible canal hematoma. Disc levels:  Within normal limits Upper chest: Negative. Other: None IMPRESSION: 1. No CT evidence for acute intracranial abnormality. 2. Straightening of the cervical spine. No acute osseous abnormality. Electronically Signed   By: Luke Bun M.D.   On: 10/21/2024 18:27   CT Cervical Spine Wo Contrast Result Date: 10/21/2024 CLINICAL DATA:  Possible overdose combative possible seizure EXAM: CT HEAD WITHOUT CONTRAST CT CERVICAL SPINE WITHOUT CONTRAST TECHNIQUE: Multidetector CT imaging of the head and cervical spine was performed following the standard protocol without intravenous contrast. Multiplanar CT image reconstructions of the cervical spine were also generated. RADIATION DOSE REDUCTION: This exam was performed according to the departmental dose-optimization program which includes automated exposure control, adjustment of the mA and/or kV according to patient size and/or  use of iterative reconstruction technique. COMPARISON:  CT brain 09/09/2022, MRI 09/13/2012 FINDINGS: CT HEAD FINDINGS Brain: No acute territorial infarction, hemorrhage or intracranial mass. Right parietal linear cortical calcifications are unchanged, thought possibly to represent venous malformation on prior MRI. No surrounding edema or adverse features. The ventricles are nonenlarged Vascular: No hyperdense vessels. Skull: Normal. Negative for fracture or focal lesion. Sinuses/Orbits: No acute finding. Other: None CT CERVICAL SPINE FINDINGS Alignment: Straightening of the cervical spine. No subluxation. Facet alignment is normal Skull base and vertebrae: No acute fracture. No primary bone lesion or focal pathologic process. Soft tissues and spinal canal: No prevertebral fluid or swelling. No visible canal hematoma. Disc levels:  Within normal limits Upper  chest: Negative. Other: None IMPRESSION: 1. No CT evidence for acute intracranial abnormality. 2. Straightening of the cervical spine. No acute osseous abnormality. Electronically Signed   By: Luke Bun M.D.   On: 10/21/2024 18:27   DG Chest Port 1 View Result Date: 10/21/2024 CLINICAL DATA:  Possible overdose. EXAM: PORTABLE CHEST 1 VIEW COMPARISON:  None Available. FINDINGS: The heart size and mediastinal contours are within normal limits. Both lungs are clear. Multiple well-defined, subcentimeter square-shaped radiopaque foci are seen overlying the region of the gastric body and gastric antrum. The visualized skeletal structures are unremarkable. IMPRESSION: 1. No active cardiopulmonary disease. 2. Findings which may represent multiple small ingested radiopaque foreign bodies overlying the region of the gastric body and gastric antrum. Correlation with the patient's history and physical examination is recommended. Electronically Signed   By: Suzen Dials M.D.   On: 10/21/2024 17:35      Scheduled Meds:  buprenorphine -naloxone   1 tablet Sublingual Daily   Chlorhexidine  Gluconate Cloth  6 each Topical Daily   gabapentin   100 mg Oral TID   levETIRAcetam   500 mg Intravenous BID   naproxen   500 mg Oral TID WC   Continuous Infusions:  sodium chloride  10 mL/hr at 10/22/24 1445   ceFEPime  (MAXIPIME ) IV Stopped (10/22/24 1411)   lactated ringers  100 mL/hr at 10/22/24 1445   norepinephrine  (LEVOPHED ) Adult infusion 4 mcg/min (10/22/24 1445)     LOS: 0 days    Total critical care time: 65 minutes.    Javier Samudio JONETTA Fairly, DO [facility]  If 7PM-7AM, please contact night-coverage [email or web address] 10/22/2024, 3:50 PM   "

## 2024-10-22 NOTE — Significant Event (Signed)
"  ° °      CROSS COVER NOTE  NAME: Javier [facility] MRN: [id number] DOB : 1991/04/15 ATTENDING PHYSICIAN: Maree Bracken D, DO    Date of Service   10/22/2024   HPI/Events of Note   [facility] cross cover at [facility] Messages received from nursing patien intermittent with episodes of cramps tachycardia and diaphoresis   Interventions   Assessment/Plan: CT abdomen/pelvis to follow up on radiopaque material in stomach on chest xray IMPRESSION: 1. Limited noncontrast exam degraded by motion; no acute abnormality identified in the abdomen or pelvis. 2. Mildly distended gallbladder, nonspecific. 3. Previously noted radiopaque material has migrated into the bowel (cecum/appendix/small bowel). Continue to monitor Consider redoing drug screen        [name] Cone NP [facility] Cross Cover 7pm-7am - check amion for availability Pager [phone]  "

## 2024-10-22 NOTE — Plan of Care (Signed)
  Problem: Clinical Measurements: Goal: Cardiovascular complication will be avoided Outcome: Progressing   Problem: Activity: Goal: Risk for activity intolerance will decrease Outcome: Progressing   Problem: Elimination: Goal: Will not experience complications related to bowel motility Outcome: Progressing   

## 2024-10-22 NOTE — Progress Notes (Signed)
" °   10/22/24 1110  Vitals  BP (!) 72/49  MAP (mmHg) (!) 54  Pulse Rate 62  ECG Heart Rate 64  Resp 19  MEWS COLOR  MEWS Score Color Yellow  Oxygen Therapy  SpO2 93 %  O2 Device Room Air  MEWS Score  MEWS Temp 0  MEWS Systolic 2  MEWS Pulse 0  MEWS RR 0  MEWS LOC 0  MEWS Score 2    Dr. Maree notified via secure chat. "

## 2024-10-23 DIAGNOSIS — R569 Unspecified convulsions: Secondary | ICD-10-CM | POA: Diagnosis not present

## 2024-10-23 LAB — COMPREHENSIVE METABOLIC PANEL WITH GFR
ALT: 68 U/L — ABNORMAL HIGH (ref 0–44)
AST: 93 U/L — ABNORMAL HIGH (ref 15–41)
Albumin: 3.5 g/dL (ref 3.5–5.0)
Alkaline Phosphatase: 49 U/L (ref 38–126)
Anion gap: 11 (ref 5–15)
BUN: 10 mg/dL (ref 6–20)
CO2: 22 mmol/L (ref 22–32)
Calcium: 8.6 mg/dL — ABNORMAL LOW (ref 8.9–10.3)
Chloride: 109 mmol/L (ref 98–111)
Creatinine, Ser: 1.02 mg/dL (ref 0.61–1.24)
GFR, Estimated: >60 mL/min
Glucose, Bld: 95 mg/dL (ref 70–99)
Potassium: 3.6 mmol/L (ref 3.5–5.1)
Sodium: 142 mmol/L (ref 135–145)
Total Bilirubin: 0.7 mg/dL (ref 0.0–1.2)
Total Protein: 5.3 g/dL — ABNORMAL LOW (ref 6.5–8.1)

## 2024-10-23 LAB — CBC
HCT: 37 % — ABNORMAL LOW (ref 39.0–52.0)
Hemoglobin: 12.7 g/dL — ABNORMAL LOW (ref 13.0–17.0)
MCH: 31.4 pg (ref 26.0–34.0)
MCHC: 34.3 g/dL (ref 30.0–36.0)
MCV: 91.6 fL (ref 80.0–100.0)
Platelets: 180 K/uL (ref 150–400)
RBC: 4.04 MIL/uL — ABNORMAL LOW (ref 4.22–5.81)
RDW: 12.2 % (ref 11.5–15.5)
WBC: 11.2 K/uL — ABNORMAL HIGH (ref 4.0–10.5)
nRBC: 0 % (ref 0.0–0.2)

## 2024-10-23 LAB — C-REACTIVE PROTEIN: CRP: 2.9 mg/dL — ABNORMAL HIGH

## 2024-10-23 LAB — MAGNESIUM: Magnesium: 1.8 mg/dL (ref 1.7–2.4)

## 2024-10-23 LAB — CORTISOL: Cortisol, Plasma: 16.9 ug/dL

## 2024-10-23 LAB — CK: Total CK: 1564 U/L — ABNORMAL HIGH (ref 49–397)

## 2024-10-23 MED ORDER — BUPRENORPHINE HCL-NALOXONE HCL 8-2 MG SL SUBL
1.0000 | SUBLINGUAL_TABLET | Freq: Two times a day (BID) | SUBLINGUAL | Status: DC
  Administered 2024-10-23 – 2024-10-24 (×2): 1 via SUBLINGUAL
  Filled 2024-10-23 (×2): qty 1

## 2024-10-23 NOTE — Progress Notes (Signed)
 " PROGRESS NOTE    Javier Johnston  FMW:[id number] DOB: [birth year] DOA: 10/21/2024 PCP: Whitfield Fallow, MD   Brief Narrative:    Javier Johnston is a 34 y.o. male with medical history significant of narcotic dependence in remission who presents to the emergency department due to witnessed seizure-like movements.  Patient endorsed having seizures at home, but most of the history was obtained from wife at bedside.  Per report, patient has been having flulike symptoms for 1 week with cough, chest congestion, runny nose, headache and fever of 101F on Monday (1/12).  Today, after waking up from a nap, he got up to walk his 2 dogs, but fell and hit the floor and started to shake with seizure-like movements and with foaming in the mouth.  EMS was activated, and noted him to be initially combative.  Narcan  was given en route.  He was postictal on arrival and HR was in the 40s, but was not in any acute distress.  Patient was admitted for concerns of seizure and also appears to have sepsis, POA.  He has been started on IV fluids along with cefepime  empirically.  He has also been started on Keppra  IV twice daily.  Brain MRI with no acute findings and EEG ordered and pending for am along with Neurology evaluation.  His blood pressures are now stable and does not require any further norepinephrine .  Assessment & Plan:   Principal Problem:   Seizures (HCC) Active Problems:   Seizure (HCC)  Assessment and Plan:  Witnessed seizure-like movements Patient presented with witnessed seizure-like movements at home (he has no history of seizures) Only episode of prior seizure was about 10 years ago and this was thought to be due to Wellbutrin No known cause of seizure at this time Continue telemetry EEG ordered and pending, plan for neurology assessment in a.m. Continue on Keppra  for now Seizure precautions and IV Ativan  as needed MRI of brain with no acute findings   SIRS with lactic acidosis Concerns  for sepsis noted, but no overt signs of infection   Mild rhabdomyolysis CK levels are certainly elevated, continue to monitor and continue aggressive IV hydration    Leukocytosis possibly reactive-improving Continue monitoring or could be indicative of infection   Ingested foreign bodies per chest x-ray Chest x-ray showed multiple small ingested radiopaque foreign bodies overlying the region of the gastric body and gastric antrum. Patient denies ingestion of any foreign bodies   Narcotic dependence in remission Continue Suboxone    Marijuana use Urine drug screen was positive for THC Patient was counseled on marijuana use cessation   DVT prophylaxis: SCDs Code Status: Full Family Communication: Wife at bedside 1/18 Disposition Plan:  Status is: Inpatient Remains inpatient appropriate because: Need for IV medications   Consultants:  None  Procedures:  None  Antimicrobials:  Anti-infectives (From admission, onward)    Start     Dose/Rate Route Frequency Ordered Stop   10/22/24 1400  ceFEPIme  (MAXIPIME ) 2 g in sodium chloride  0.9 % 100 mL IVPB        2 g 200 mL/hr over 30 Minutes Intravenous Every 8 hours 10/22/24 1304         Subjective: Patient seen and evaluated today and appears to be mostly somnolent and unresponsive.  Objective: Vitals:   10/23/24 0700 10/23/24 0758 10/23/24 0800 10/23/24 1100  BP: 116/69 117/79 117/79 (!) 124/91  Pulse: (!) 50 (!) 54 (!) 52 70  Resp: 18 20 (!) 22 20  Temp:  98.5 F (36.9 C)  98.1 F (36.7 C)  TempSrc:  Axillary  Oral  SpO2: 94% 96% 97% 98%  Weight:      Height:        Intake/Output Summary (Last 24 hours) at 10/23/2024 1344 Last data filed at 10/23/2024 0800 Gross per 24 hour  Intake 3408.31 ml  Output 700 ml  Net 2708.31 ml   Filed Weights   10/21/24 2100  Weight: 50.8 kg    Examination:  General exam: Appears somnolent but arousable Respiratory system: Clear to auscultation. Respiratory effort  normal. Cardiovascular system: S1 & S2 heard, RRR.  Gastrointestinal system: Abdomen is soft Central nervous system: Alert and awake Extremities: No edema Skin: No significant lesions noted Psychiatry: Flat affect.    Data Reviewed: I have personally reviewed following labs and imaging studies  CBC: Recent Labs  Lab 10/21/24 1715 10/22/24 0559 10/23/24 0358  WBC 16.7* 16.1* 11.2*  NEUTROABS 12.7*  --   --   HGB 16.2 13.4 12.7*  HCT 49.7 37.6* 37.0*  MCV 97.1 90.6 91.6  PLT 310 205 180   Basic Metabolic Panel: Recent Labs  Lab 10/21/24 1715 10/21/24 2112 10/22/24 0559 10/23/24 0358  NA 141 141 142 142  K 3.3* 3.4* 3.8 3.6  CL 104 106 107 109  CO2 8* 20* 19* 22  GLUCOSE 237* 105* 112* 95  BUN 7 7 8 10   CREATININE 1.27* 0.93 1.21 1.02  CALCIUM 9.4 9.0 9.0 8.6*  MG  --  2.5* 2.1 1.8  PHOS  --   --  2.3*  --    GFR: Estimated Creatinine Clearance: 73.3 mL/min (by C-G formula based on SCr of 1.02 mg/dL). Liver Function Tests: Recent Labs  Lab 10/21/24 1715 10/22/24 0559 10/23/24 0358  AST 60* 94* 93*  ALT 89* 82* 68*  ALKPHOS 77 61 49  BILITOT 0.5 0.8 0.7  PROT 7.0 6.2* 5.3*  ALBUMIN 4.4 4.2 3.5   Recent Labs  Lab 10/22/24 0559  LIPASE 11   No results for input(s): AMMONIA in the last 168 hours. Coagulation Profile: No results for input(s): INR, PROTIME in the last 168 hours. Cardiac Enzymes: Recent Labs  Lab 10/21/24 2112 10/23/24 0358  CKTOTAL 788* 1,564*   BNP (last 3 results) No results for input(s): PROBNP in the last 8760 hours. HbA1C: Recent Labs    10/21/24 2112  HGBA1C 5.2   CBG: Recent Labs  Lab 10/21/24 1700 10/22/24 0043  GLUCAP 240* 117*   Lipid Profile: No results for input(s): CHOL, HDL, LDLCALC, TRIG, CHOLHDL, LDLDIRECT in the last 72 hours. Thyroid Function Tests: No results for input(s): TSH, T4TOTAL, FREET4, T3FREE, THYROIDAB in the last 72 hours. Anemia Panel: No results for  input(s): VITAMINB12, FOLATE, FERRITIN, TIBC, IRON, RETICCTPCT in the last 72 hours. Sepsis Labs: Recent Labs  Lab 10/22/24 0103 10/22/24 0559 10/22/24 0809 10/22/24 1341 10/22/24 1614  PROCALCITON  --   --   --  0.16  --   LATICACIDVEN 3.2* 2.8* 3.1*  --  1.7    Recent Results (from the past 240 hours)  Resp panel by RT-PCR (RSV, Flu A&B, Covid) Anterior Nasal Swab     Status: None   Collection Time: 10/21/24  5:42 PM   Specimen: Anterior Nasal Swab  Result Value Ref Range Status   SARS Coronavirus 2 by RT PCR NEGATIVE NEGATIVE Final    Comment: (NOTE) SARS-CoV-2 target nucleic acids are NOT DETECTED.  The SARS-CoV-2 RNA is generally detectable in upper respiratory  specimens during the acute phase of infection. The lowest concentration of SARS-CoV-2 viral copies this assay can detect is 138 copies/mL. A negative result does not preclude SARS-Cov-2 infection and should not be used as the sole basis for treatment or other patient management decisions. A negative result may occur with  improper specimen collection/handling, submission of specimen other than nasopharyngeal swab, presence of viral mutation(s) within the areas targeted by this assay, and inadequate number of viral copies(<138 copies/mL). A negative result must be combined with clinical observations, patient history, and epidemiological information. The expected result is Negative.  Fact Sheet for Patients:  bloggercourse.com  Fact Sheet for Healthcare Providers:  seriousbroker.it  This test is no t yet approved or cleared by the [place]  FDA and  has been authorized for detection and/or diagnosis of SARS-CoV-2 by FDA under an Emergency Use Authorization (EUA). This EUA will remain  in effect (meaning this test can be used) for the duration of the COVID-19 declaration under Section 564(b)(1) of the Act, 21 U.S.C.section 360bbb-3(b)(1), unless  the authorization is terminated  or revoked sooner.       Influenza A by PCR NEGATIVE NEGATIVE Final   Influenza B by PCR NEGATIVE NEGATIVE Final    Comment: (NOTE) The Xpert Xpress SARS-CoV-2/FLU/RSV plus assay is intended as an aid in the diagnosis of influenza from Nasopharyngeal swab specimens and should not be used as a sole basis for treatment. Nasal washings and aspirates are unacceptable for Xpert Xpress SARS-CoV-2/FLU/RSV testing.  Fact Sheet for Patients: bloggercourse.com  Fact Sheet for Healthcare Providers: seriousbroker.it  This test is not yet approved or cleared by the [place]  FDA and has been authorized for detection and/or diagnosis of SARS-CoV-2 by FDA under an Emergency Use Authorization (EUA). This EUA will remain in effect (meaning this test can be used) for the duration of the COVID-19 declaration under Section 564(b)(1) of the Act, 21 U.S.C. section 360bbb-3(b)(1), unless the authorization is terminated or revoked.     Resp Syncytial Virus by PCR NEGATIVE NEGATIVE Final    Comment: (NOTE) Fact Sheet for Patients: bloggercourse.com  Fact Sheet for Healthcare Providers: seriousbroker.it  This test is not yet approved or cleared by the [place]  FDA and has been authorized for detection and/or diagnosis of SARS-CoV-2 by FDA under an Emergency Use Authorization (EUA). This EUA will remain in effect (meaning this test can be used) for the duration of the COVID-19 declaration under Section 564(b)(1) of the Act, 21 U.S.C. section 360bbb-3(b)(1), unless the authorization is terminated or revoked.  Performed at [facility], [address], [place], [place] [address]   MRSA Next Gen by PCR, Nasal     Status: None   Collection Time: 10/21/24  9:40 PM   Specimen: Nasal Mucosa; Nasal Swab  Result Value Ref Range Status   MRSA by PCR Next Gen NOT  DETECTED NOT DETECTED Final    Comment: (NOTE) The GeneXpert MRSA Assay (FDA approved for NASAL specimens only), is one component of a comprehensive MRSA colonization surveillance program. It is not intended to diagnose MRSA infection nor to guide or monitor treatment for MRSA infections. Test performance is not FDA approved in patients less than 37 years old. Performed at [facility], [address] [place], [place] [address]   Culture, blood (Routine X 2) w Reflex to ID Panel     Status: None (Preliminary result)   Collection Time: 10/22/24  1:41 PM   Specimen: BLOOD LEFT HAND  Result Value Ref Range Status  Specimen Description   Final    BLOOD LEFT HAND BOTTLES DRAWN AEROBIC AND ANAEROBIC   Special Requests   Final    Blood Culture results may not be optimal due to an inadequate volume of blood received in culture bottles   Culture   Final    NO GROWTH < 24 HOURS Performed at [facility], [address], [place], [place] [address]    Report Status PENDING  Incomplete  Culture, blood (Routine X 2) w Reflex to ID Panel     Status: None (Preliminary result)   Collection Time: 10/22/24  1:41 PM   Specimen: BLOOD RIGHT HAND  Result Value Ref Range Status   Specimen Description   Final    BLOOD RIGHT HAND BOTTLES DRAWN AEROBIC AND ANAEROBIC   Special Requests   Final    Blood Culture results may not be optimal due to an inadequate volume of blood received in culture bottles   Culture   Final    NO GROWTH < 24 HOURS Performed at [facility], [address] [place], [place] [address]    Report Status PENDING  Incomplete         Radiology Studies: US  EKG SITE RITE Result Date: 10/22/2024 If Site Rite image not attached, placement could not be confirmed due to current cardiac rhythm.  CT TEMPORAL BONES WO CONTRAST Result Date: 10/22/2024 EXAM: CT TEMPORAL BONES WITHOUT CONTRAST 10/22/2024 02:51:00 PM TECHNIQUE: CT of the temporal bones was performed without the  administration of intravenous contrast. Multiplanar reformatted images are provided for review. Automated exposure control, iterative reconstruction, and/or weight based adjustment of the mA/kV was utilized to reduce the radiation dose to as low as reasonably achievable. COMPARISON: MR head without contrast 10/23/2023 and MR head without and with contrast 09/13/2024. A CT head without contrast 10/22/2023. CLINICAL HISTORY: Mastoiditis. FINDINGS: RIGHT TEMPORAL BONE: EXTERNAL AUDITORY CANAL: Minimal debris, likely cerumen is present within the right external auditory canal. No bony erosion. Scutum is intact. MIDDLE EAR CAVITY: Clear. Ossicular chain is intact. MASTOID AIR CELLS: Fluid within chronically coalescent air cells in the inferior right mastoid is stable. No inflammatory changes are present adjacent to the mastoid. INNER EAR: The cochlea and vestibule are unremarkable. Normal mineralization of the otic capsule. Normal semicircular canals. The vestibular aqueduct is not dilated. INTERNAL AUDITORY CANAL: Unremarkable. Normal bony canal of the facial nerve. LEFT TEMPORAL BONE: EXTERNAL AUDITORY CANAL: Debris in the left external auditory canal likely represents cerumen. No bony erosion. Scutum is intact. MIDDLE EAR CAVITY: Clear. Ossicular chain is intact. MASTOID AIR CELLS: Clear. INNER EAR: The cochlea and vestibule are unremarkable. Normal mineralization of the otic capsule. Normal semicircular canals. The vestibular aqueduct is not dilated. INTERNAL AUDITORY CANAL: Unremarkable. Normal bony canal of the facial nerve. VASCULAR: A high riding right jugular bulb is noted. The sigmoid plate is intact. Normal carotid canals. BRAIN: Unremarkable. ORBITS: No acute abnormality. SINUSES: Minimal mucosal thickening is present in the left maxillary sinus. IMPRESSION: 1. Stable fluid within chronically opacified air cells in the inferior right mastoid without adjacent inflammatory changes. Electronically signed by:  Lonni Necessary MD 10/22/2024 03:22 PM EST RP Workstation: [id number]   MR BRAIN WO CONTRAST Result Date: 10/22/2024 EXAM: MRI BRAIN WITHOUT CONTRAST 10/22/2024 10:59:05 AM TECHNIQUE: Multiplanar multisequence MRI of the head/brain was performed without the administration of intravenous contrast. COMPARISON: MR Head without then with IV Contrast 09/13/2012; 11/27/2003. CT head without contrast 08/11/2025. CLINICAL HISTORY: Neuro deficit, acute, stroke suspected; Seizure, new-onset, no history  of trauma. FINDINGS: BRAIN AND VENTRICLES: No acute infarct. No intracranial hemorrhage. No mass. No midline shift. No hydrocephalus. The sella is unremarkable. Normal flow voids. Dedicated imaging of the temporal lobes demonstrate symmetric size and signal of the hippocampal structures. ORBITS: No significant abnormality. SINUSES AND MASTOIDS: Right mastoid effusion is new. No obstructing nasopharyngeal lesion is present. BONES AND SOFT TISSUES: Normal marrow signal. No soft tissue abnormality. IMPRESSION: 1. No acute intracranial abnormality. 2. New right mastoid effusion without an obstructing nasopharyngeal lesion. Electronically signed by: Lonni Necessary MD 10/22/2024 12:36 PM EST RP Workstation: [id number]   CT ABDOMEN PELVIS W CONTRAST Result Date: 10/22/2024 EXAM: CT ABDOMEN AND PELVIS WITHOUT CONTRAST 10/22/2024 03:55:43 AM TECHNIQUE: CT of the abdomen and pelvis was performed without the administration of intravenous contrast. Multiplanar reformatted images are provided for review. Automated exposure control, iterative reconstruction, and/or weight-based adjustment of the mA/kV was utilized to reduce the radiation dose to as low as reasonably achievable. COMPARISON: CT of the abdomen and pelvis dated 06/24/2007 and AP radiograph of the chest dated 10/21/2024. CLINICAL HISTORY: Abdominal pain, acute, nonlocalized. FINDINGS: LIMITATIONS: The study is limited by patient motion and the absence of  intravenous contrast. LOWER CHEST: No acute abnormality. LIVER: The liver is unremarkable. GALLBLADDER AND BILE DUCTS: The gallbladder is mildly distended. No biliary ductal dilatation. SPLEEN: No acute abnormality. PANCREAS: No acute abnormality. ADRENAL GLANDS: No acute abnormality. KIDNEYS, URETERS AND BLADDER: No stones in the kidneys or ureters. No hydronephrosis. No perinephric or periureteral stranding. Urinary bladder is unremarkable. GI AND BOWEL: Stomach demonstrates no acute abnormality. The radiopaque opacities previously noted within the stomach now appear to be within the cecum and appendix or small bowel. There is no bowel obstruction. PERITONEUM AND RETROPERITONEUM: No ascites. No free air. VASCULATURE: Aorta is normal in caliber. LYMPH NODES: No lymphadenopathy. REPRODUCTIVE ORGANS: No acute abnormality. BONES AND SOFT TISSUES: No acute osseous abnormality. No focal soft tissue abnormality. IMPRESSION: 1. Limited noncontrast exam degraded by motion; no acute abnormality identified in the abdomen or pelvis. 2. Mildly distended gallbladder, nonspecific. 3. Previously noted radiopaque material has migrated into the bowel (cecum/appendix/small bowel). Electronically signed by: Evalene Coho MD 10/22/2024 04:07 AM EST RP Workstation: [id number]   CT Head Wo Contrast Result Date: 10/21/2024 CLINICAL DATA:  Possible overdose combative possible seizure EXAM: CT HEAD WITHOUT CONTRAST CT CERVICAL SPINE WITHOUT CONTRAST TECHNIQUE: Multidetector CT imaging of the head and cervical spine was performed following the standard protocol without intravenous contrast. Multiplanar CT image reconstructions of the cervical spine were also generated. RADIATION DOSE REDUCTION: This exam was performed according to the departmental dose-optimization program which includes automated exposure control, adjustment of the mA and/or kV according to patient size and/or use of iterative reconstruction technique. COMPARISON:   CT brain 09/09/2022, MRI 09/13/2012 FINDINGS: CT HEAD FINDINGS Brain: No acute territorial infarction, hemorrhage or intracranial mass. Right parietal linear cortical calcifications are unchanged, thought possibly to represent venous malformation on prior MRI. No surrounding edema or adverse features. The ventricles are nonenlarged Vascular: No hyperdense vessels. Skull: Normal. Negative for fracture or focal lesion. Sinuses/Orbits: No acute finding. Other: None CT CERVICAL SPINE FINDINGS Alignment: Straightening of the cervical spine. No subluxation. Facet alignment is normal Skull base and vertebrae: No acute fracture. No primary bone lesion or focal pathologic process. Soft tissues and spinal canal: No prevertebral fluid or swelling. No visible canal hematoma. Disc levels:  Within normal limits Upper chest: Negative. Other: None IMPRESSION: 1. No CT evidence for acute intracranial abnormality. 2.  Straightening of the cervical spine. No acute osseous abnormality. Electronically Signed   By: Luke Bun M.D.   On: 10/21/2024 18:27   CT Cervical Spine Wo Contrast Result Date: 10/21/2024 CLINICAL DATA:  Possible overdose combative possible seizure EXAM: CT HEAD WITHOUT CONTRAST CT CERVICAL SPINE WITHOUT CONTRAST TECHNIQUE: Multidetector CT imaging of the head and cervical spine was performed following the standard protocol without intravenous contrast. Multiplanar CT image reconstructions of the cervical spine were also generated. RADIATION DOSE REDUCTION: This exam was performed according to the departmental dose-optimization program which includes automated exposure control, adjustment of the mA and/or kV according to patient size and/or use of iterative reconstruction technique. COMPARISON:  CT brain 09/09/2022, MRI 09/13/2012 FINDINGS: CT HEAD FINDINGS Brain: No acute territorial infarction, hemorrhage or intracranial mass. Right parietal linear cortical calcifications are unchanged, thought possibly to  represent venous malformation on prior MRI. No surrounding edema or adverse features. The ventricles are nonenlarged Vascular: No hyperdense vessels. Skull: Normal. Negative for fracture or focal lesion. Sinuses/Orbits: No acute finding. Other: None CT CERVICAL SPINE FINDINGS Alignment: Straightening of the cervical spine. No subluxation. Facet alignment is normal Skull base and vertebrae: No acute fracture. No primary bone lesion or focal pathologic process. Soft tissues and spinal canal: No prevertebral fluid or swelling. No visible canal hematoma. Disc levels:  Within normal limits Upper chest: Negative. Other: None IMPRESSION: 1. No CT evidence for acute intracranial abnormality. 2. Straightening of the cervical spine. No acute osseous abnormality. Electronically Signed   By: Luke Bun M.D.   On: 10/21/2024 18:27   DG Chest Port 1 View Result Date: 10/21/2024 CLINICAL DATA:  Possible overdose. EXAM: PORTABLE CHEST 1 VIEW COMPARISON:  None Available. FINDINGS: The heart size and mediastinal contours are within normal limits. Both lungs are clear. Multiple well-defined, subcentimeter square-shaped radiopaque foci are seen overlying the region of the gastric body and gastric antrum. The visualized skeletal structures are unremarkable. IMPRESSION: 1. No active cardiopulmonary disease. 2. Findings which may represent multiple small ingested radiopaque foreign bodies overlying the region of the gastric body and gastric antrum. Correlation with the patient's history and physical examination is recommended. Electronically Signed   By: Suzen Dials M.D.   On: 10/21/2024 17:35      Scheduled Meds:  buprenorphine -naloxone   1 tablet Sublingual BID   Chlorhexidine  Gluconate Cloth  6 each Topical Daily   gabapentin   100 mg Oral TID   levETIRAcetam   500 mg Intravenous BID   naproxen   500 mg Oral TID WC   Continuous Infusions:  sodium chloride  10 mL/hr at 10/22/24 1900   ceFEPime  (MAXIPIME ) IV 2 g  (10/23/24 1306)   lactated ringers  125 mL/hr at 10/23/24 0926     LOS: 1 day    Total care time: 55 minutes.    Javier Johnston Javier Fairly, DO [facility]  If 7PM-7AM, please contact night-coverage [email or web address] 10/23/2024, 1:44 PM   "

## 2024-10-23 NOTE — Plan of Care (Signed)

## 2024-10-23 NOTE — Progress Notes (Signed)
 Secure chat with Jerilynn RN re PICC to be placed.  States will confirm PICC need when doctor rounds this am.

## 2024-10-24 ENCOUNTER — Inpatient Hospital Stay (HOSPITAL_COMMUNITY): Admit: 2024-10-24 | Discharge: 2024-10-24 | Disposition: A | Attending: Internal Medicine

## 2024-10-24 DIAGNOSIS — R569 Unspecified convulsions: Secondary | ICD-10-CM | POA: Diagnosis not present

## 2024-10-24 DIAGNOSIS — Z820 Family history of epilepsy and other diseases of the nervous system: Secondary | ICD-10-CM | POA: Diagnosis not present

## 2024-10-24 LAB — CBC
HCT: 36.8 % — ABNORMAL LOW (ref 39.0–52.0)
Hemoglobin: 12.7 g/dL — ABNORMAL LOW (ref 13.0–17.0)
MCH: 31.3 pg (ref 26.0–34.0)
MCHC: 34.5 g/dL (ref 30.0–36.0)
MCV: 90.6 fL (ref 80.0–100.0)
Platelets: 160 K/uL (ref 150–400)
RBC: 4.06 MIL/uL — ABNORMAL LOW (ref 4.22–5.81)
RDW: 11.9 % (ref 11.5–15.5)
WBC: 9.5 K/uL (ref 4.0–10.5)
nRBC: 0 % (ref 0.0–0.2)

## 2024-10-24 LAB — COMPREHENSIVE METABOLIC PANEL WITH GFR
ALT: 57 U/L — ABNORMAL HIGH (ref 0–44)
AST: 74 U/L — ABNORMAL HIGH (ref 15–41)
Albumin: 3.3 g/dL — ABNORMAL LOW (ref 3.5–5.0)
Alkaline Phosphatase: 50 U/L (ref 38–126)
Anion gap: 12 (ref 5–15)
BUN: 12 mg/dL (ref 6–20)
CO2: 23 mmol/L (ref 22–32)
Calcium: 8.4 mg/dL — ABNORMAL LOW (ref 8.9–10.3)
Chloride: 108 mmol/L (ref 98–111)
Creatinine, Ser: 0.98 mg/dL (ref 0.61–1.24)
GFR, Estimated: >60 mL/min
Glucose, Bld: 125 mg/dL — ABNORMAL HIGH (ref 70–99)
Potassium: 3.3 mmol/L — ABNORMAL LOW (ref 3.5–5.1)
Sodium: 142 mmol/L (ref 135–145)
Total Bilirubin: 0.5 mg/dL (ref 0.0–1.2)
Total Protein: 5.2 g/dL — ABNORMAL LOW (ref 6.5–8.1)

## 2024-10-24 LAB — CK: Total CK: 1324 U/L — ABNORMAL HIGH (ref 49–397)

## 2024-10-24 LAB — MAGNESIUM: Magnesium: 1.7 mg/dL (ref 1.7–2.4)

## 2024-10-24 MED ORDER — GABAPENTIN 100 MG PO CAPS: 100.0000 mg | ORAL_CAPSULE | Freq: Three times a day (TID) | ORAL | 0 refills | Status: AC

## 2024-10-24 MED ORDER — MELOXICAM 7.5 MG PO TABS: 7.5000 mg | ORAL_TABLET | Freq: Every day | ORAL | 0 refills | Status: AC

## 2024-10-24 MED ORDER — LEVETIRACETAM 500 MG PO TABS: 500.0000 mg | ORAL_TABLET | Freq: Two times a day (BID) | ORAL | Status: DC

## 2024-10-24 MED ORDER — LEVETIRACETAM 500 MG PO TABS: 500.0000 mg | ORAL_TABLET | Freq: Two times a day (BID) | ORAL | 2 refills | Status: AC

## 2024-10-24 MED ORDER — CLONAZEPAM 2 MG PO TABS: 2.0000 mg | ORAL_TABLET | ORAL | 0 refills | Status: AC | PRN

## 2024-10-24 NOTE — Discharge Summary (Signed)
 Physician Discharge Summary  Javier Johnston FMW:[id number] DOB: 11-25-1990 DOA: 10/21/2024  PCP: Whitfield Fallow, MD  Admit date: 10/21/2024  Discharge date: 10/24/2024  Admitted From:Home  Disposition:  Home  Recommendations for Outpatient Follow-up:  Follow up with PCP in 1-2 weeks Follow-up with neurology with referral sent in 4 weeks Continue Keppra  500 mg twice daily as recommended per neurology Clonazepam  prescribed as needed for breakthrough Continue other home medications as prior  Home Health: None  Equipment/Devices: None  Discharge Condition:Stable  CODE STATUS: Full  Diet recommendation: Heart Healthy  Brief/Interim Summary: Javier Johnston is a 34 y.o. male with medical history significant of narcotic dependence in remission who presents to the emergency department due to witnessed seizure-like movements.  Patient endorsed having seizures at home, but most of the history was obtained from wife at bedside.  Per report, patient has been having flulike symptoms for 1 week with cough, chest congestion, runny nose, headache and fever of 101F on Monday (1/12).  Today, after waking up from a nap, he got up to walk his 2 dogs, but fell and hit the floor and started to shake with seizure-like movements and with foaming in the mouth.  EMS was activated, and noted him to be initially combative.  Narcan  was given en route.  He was postictal on arrival and HR was in the 40s, but was not in any acute distress.  Patient was admitted for concerns of seizure and also appears to have sepsis, POA.  He has been started on IV fluids along with cefepime  empirically.  He has also been started on Keppra  IV twice daily.  Brain MRI with no acute findings and EEG ordered and pending for am along with Neurology evaluation.  His blood pressures are now stable and does not require any further norepinephrine .  He is overall back to his usual baseline and has been assessed by neurology with  recommendations to continue Keppra  for now and follow-up with neurology in 4 weeks.  EEG performed and is within normal limits.  No other acute events or concerns noted.  He was noted to have some mild rhabdomyolysis which is downward trending with IV fluid and has been encouraged to stay hydrated.  Discharge Diagnoses:  Principal Problem:   Seizures (HCC) Active Problems:   Seizure [facility])  Principal discharge diagnosis: New onset seizure  Discharge Instructions  Discharge Instructions     Ambulatory referral to Neurology   Complete by: As directed    An appointment is requested in approximately: 4 weeks   Increase activity slowly   Complete by: As directed       Allergies as of 10/24/2024       Reactions   Penicillins Other (See Comments)   Unknown Childhood reaction   Prozac [fluoxetine Hcl] Other (See Comments)   made me crazy   Wellbutrin [bupropion]         Medication List     STOP taking these medications    ibuprofen  800 MG tablet Commonly known as: ADVIL        TAKE these medications    Buprenorphine  HCl-Naloxone  HCl 8-2 MG Film Place 1 Film under the tongue 2 (two) times daily.   clonazePAM  2 MG tablet Commonly known as: KlonoPIN  Take 1 tablet (2 mg total) by mouth as needed (seizure).   gabapentin  100 MG capsule Commonly known as: NEURONTIN  Take 1 capsule (100 mg total) by mouth 3 (three) times daily.   levETIRAcetam  500 MG tablet Commonly known as: KEPPRA   Take 1 tablet (500 mg total) by mouth 2 (two) times daily.   meloxicam  7.5 MG tablet Commonly known as: Mobic  Take 1 tablet (7.5 mg total) by mouth daily.        Follow-up Information     Whitfield Fallow, MD. Schedule an appointment as soon as possible for a visit in 1 week(s).   Specialty: Family Medicine Contact information: [address] [place] [address] [phone]                Allergies[1]  Consultations: Neurology   Procedures/Studies: EEG  adult Result Date: 11-19-2024 Shelton Arlin KIDD, MD     Nov 19, 2024  4:22 PM Patient Name: Javier Johnston MRN: [id number] Epilepsy Attending: Arlin KIDD Shelton Referring Physician/Provider: Adefeso, Oladapo, DO Date: 11/19/2024 Duration: 24.17 mins Patient history: 34 year old male who presented with new onset seizure. EEG to evaluate for seizure Level of alertness: Awake AEDs during EEG study: LEV Technical aspects: This EEG study was done with scalp electrodes positioned according to the 10-20 International system of electrode placement. Electrical activity was reviewed with band pass filter of 1-70Hz , sensitivity of 7 uV/mm, display speed of 42mm/sec with a 60Hz  notched filter applied as appropriate. EEG data were recorded continuously and digitally stored.  Video monitoring was available and reviewed as appropriate. Description: The posterior dominant rhythm consists of 9-10 Hz activity of moderate voltage (25-35 uV) seen predominantly in posterior head regions, symmetric and reactive to eye opening and eye closing. Hyperventilation did not show any EEG change.  Physiologic photic driving was seen during photic stimulation.  IMPRESSION: This study is within normal limits. No seizures or epileptiform discharges were seen throughout the recording. A normal interictal EEG does not exclude the diagnosis of epilepsy. Priyanka O Yadav   US  EKG SITE RITE Result Date: 10/22/2024 If Site Rite image not attached, placement could not be confirmed due to current cardiac rhythm.  CT TEMPORAL BONES WO CONTRAST Result Date: 10/22/2024 EXAM: CT TEMPORAL BONES WITHOUT CONTRAST 10/22/2024 02:51:00 PM TECHNIQUE: CT of the temporal bones was performed without the administration of intravenous contrast. Multiplanar reformatted images are provided for review. Automated exposure control, iterative reconstruction, and/or weight based adjustment of the mA/kV was utilized to reduce the radiation dose to as low as reasonably  achievable. COMPARISON: MR head without contrast 10/23/2023 and MR head without and with contrast 09/13/2024. A CT head without contrast 10/22/2023. CLINICAL HISTORY: Mastoiditis. FINDINGS: RIGHT TEMPORAL BONE: EXTERNAL AUDITORY CANAL: Minimal debris, likely cerumen is present within the right external auditory canal. No bony erosion. Scutum is intact. MIDDLE EAR CAVITY: Clear. Ossicular chain is intact. MASTOID AIR CELLS: Fluid within chronically coalescent air cells in the inferior right mastoid is stable. No inflammatory changes are present adjacent to the mastoid. INNER EAR: The cochlea and vestibule are unremarkable. Normal mineralization of the otic capsule. Normal semicircular canals. The vestibular aqueduct is not dilated. INTERNAL AUDITORY CANAL: Unremarkable. Normal bony canal of the facial nerve. LEFT TEMPORAL BONE: EXTERNAL AUDITORY CANAL: Debris in the left external auditory canal likely represents cerumen. No bony erosion. Scutum is intact. MIDDLE EAR CAVITY: Clear. Ossicular chain is intact. MASTOID AIR CELLS: Clear. INNER EAR: The cochlea and vestibule are unremarkable. Normal mineralization of the otic capsule. Normal semicircular canals. The vestibular aqueduct is not dilated. INTERNAL AUDITORY CANAL: Unremarkable. Normal bony canal of the facial nerve. VASCULAR: A high riding right jugular bulb is noted. The sigmoid plate is intact. Normal carotid canals. BRAIN: Unremarkable. ORBITS: No acute abnormality. SINUSES: Minimal mucosal  thickening is present in the left maxillary sinus. IMPRESSION: 1. Stable fluid within chronically opacified air cells in the inferior right mastoid without adjacent inflammatory changes. Electronically signed by: Lonni Necessary MD 10/22/2024 03:22 PM EST RP Workstation: [id number]   MR BRAIN WO CONTRAST Result Date: 10/22/2024 EXAM: MRI BRAIN WITHOUT CONTRAST 10/22/2024 10:59:05 AM TECHNIQUE: Multiplanar multisequence MRI of the head/brain was performed without  the administration of intravenous contrast. COMPARISON: MR Head without then with IV Contrast 09/13/2012; 11/27/2003. CT head without contrast 08/11/2025. CLINICAL HISTORY: Neuro deficit, acute, stroke suspected; Seizure, new-onset, no history of trauma. FINDINGS: BRAIN AND VENTRICLES: No acute infarct. No intracranial hemorrhage. No mass. No midline shift. No hydrocephalus. The sella is unremarkable. Normal flow voids. Dedicated imaging of the temporal lobes demonstrate symmetric size and signal of the hippocampal structures. ORBITS: No significant abnormality. SINUSES AND MASTOIDS: Right mastoid effusion is new. No obstructing nasopharyngeal lesion is present. BONES AND SOFT TISSUES: Normal marrow signal. No soft tissue abnormality. IMPRESSION: 1. No acute intracranial abnormality. 2. New right mastoid effusion without an obstructing nasopharyngeal lesion. Electronically signed by: Lonni Necessary MD 10/22/2024 12:36 PM EST RP Workstation: [id number]   CT ABDOMEN PELVIS W CONTRAST Result Date: 10/22/2024 EXAM: CT ABDOMEN AND PELVIS WITHOUT CONTRAST 10/22/2024 03:55:43 AM TECHNIQUE: CT of the abdomen and pelvis was performed without the administration of intravenous contrast. Multiplanar reformatted images are provided for review. Automated exposure control, iterative reconstruction, and/or weight-based adjustment of the mA/kV was utilized to reduce the radiation dose to as low as reasonably achievable. COMPARISON: CT of the abdomen and pelvis dated 06/24/2007 and AP radiograph of the chest dated 10/21/2024. CLINICAL HISTORY: Abdominal pain, acute, nonlocalized. FINDINGS: LIMITATIONS: The study is limited by patient motion and the absence of intravenous contrast. LOWER CHEST: No acute abnormality. LIVER: The liver is unremarkable. GALLBLADDER AND BILE DUCTS: The gallbladder is mildly distended. No biliary ductal dilatation. SPLEEN: No acute abnormality. PANCREAS: No acute abnormality. ADRENAL GLANDS: No  acute abnormality. KIDNEYS, URETERS AND BLADDER: No stones in the kidneys or ureters. No hydronephrosis. No perinephric or periureteral stranding. Urinary bladder is unremarkable. GI AND BOWEL: Stomach demonstrates no acute abnormality. The radiopaque opacities previously noted within the stomach now appear to be within the cecum and appendix or small bowel. There is no bowel obstruction. PERITONEUM AND RETROPERITONEUM: No ascites. No free air. VASCULATURE: Aorta is normal in caliber. LYMPH NODES: No lymphadenopathy. REPRODUCTIVE ORGANS: No acute abnormality. BONES AND SOFT TISSUES: No acute osseous abnormality. No focal soft tissue abnormality. IMPRESSION: 1. Limited noncontrast exam degraded by motion; no acute abnormality identified in the abdomen or pelvis. 2. Mildly distended gallbladder, nonspecific. 3. Previously noted radiopaque material has migrated into the bowel (cecum/appendix/small bowel). Electronically signed by: Evalene Coho MD 10/22/2024 04:07 AM EST RP Workstation: [id number]   CT Head Wo Contrast Result Date: 10/21/2024 CLINICAL DATA:  Possible overdose combative possible seizure EXAM: CT HEAD WITHOUT CONTRAST CT CERVICAL SPINE WITHOUT CONTRAST TECHNIQUE: Multidetector CT imaging of the head and cervical spine was performed following the standard protocol without intravenous contrast. Multiplanar CT image reconstructions of the cervical spine were also generated. RADIATION DOSE REDUCTION: This exam was performed according to the departmental dose-optimization program which includes automated exposure control, adjustment of the mA and/or kV according to patient size and/or use of iterative reconstruction technique. COMPARISON:  CT brain 09/09/2022, MRI 09/13/2012 FINDINGS: CT HEAD FINDINGS Brain: No acute territorial infarction, hemorrhage or intracranial mass. Right parietal linear cortical calcifications are unchanged, thought possibly to represent venous malformation  on prior MRI. No  surrounding edema or adverse features. The ventricles are nonenlarged Vascular: No hyperdense vessels. Skull: Normal. Negative for fracture or focal lesion. Sinuses/Orbits: No acute finding. Other: None CT CERVICAL SPINE FINDINGS Alignment: Straightening of the cervical spine. No subluxation. Facet alignment is normal Skull base and vertebrae: No acute fracture. No primary bone lesion or focal pathologic process. Soft tissues and spinal canal: No prevertebral fluid or swelling. No visible canal hematoma. Disc levels:  Within normal limits Upper chest: Negative. Other: None IMPRESSION: 1. No CT evidence for acute intracranial abnormality. 2. Straightening of the cervical spine. No acute osseous abnormality. Electronically Signed   By: Luke Bun M.D.   On: 10/21/2024 18:27   CT Cervical Spine Wo Contrast Result Date: 10/21/2024 CLINICAL DATA:  Possible overdose combative possible seizure EXAM: CT HEAD WITHOUT CONTRAST CT CERVICAL SPINE WITHOUT CONTRAST TECHNIQUE: Multidetector CT imaging of the head and cervical spine was performed following the standard protocol without intravenous contrast. Multiplanar CT image reconstructions of the cervical spine were also generated. RADIATION DOSE REDUCTION: This exam was performed according to the departmental dose-optimization program which includes automated exposure control, adjustment of the mA and/or kV according to patient size and/or use of iterative reconstruction technique. COMPARISON:  CT brain 09/09/2022, MRI 09/13/2012 FINDINGS: CT HEAD FINDINGS Brain: No acute territorial infarction, hemorrhage or intracranial mass. Right parietal linear cortical calcifications are unchanged, thought possibly to represent venous malformation on prior MRI. No surrounding edema or adverse features. The ventricles are nonenlarged Vascular: No hyperdense vessels. Skull: Normal. Negative for fracture or focal lesion. Sinuses/Orbits: No acute finding. Other: None CT CERVICAL SPINE  FINDINGS Alignment: Straightening of the cervical spine. No subluxation. Facet alignment is normal Skull base and vertebrae: No acute fracture. No primary bone lesion or focal pathologic process. Soft tissues and spinal canal: No prevertebral fluid or swelling. No visible canal hematoma. Disc levels:  Within normal limits Upper chest: Negative. Other: None IMPRESSION: 1. No CT evidence for acute intracranial abnormality. 2. Straightening of the cervical spine. No acute osseous abnormality. Electronically Signed   By: Luke Bun M.D.   On: 10/21/2024 18:27   DG Chest Port 1 View Result Date: 10/21/2024 CLINICAL DATA:  Possible overdose. EXAM: PORTABLE CHEST 1 VIEW COMPARISON:  None Available. FINDINGS: The heart size and mediastinal contours are within normal limits. Both lungs are clear. Multiple well-defined, subcentimeter square-shaped radiopaque foci are seen overlying the region of the gastric body and gastric antrum. The visualized skeletal structures are unremarkable. IMPRESSION: 1. No active cardiopulmonary disease. 2. Findings which may represent multiple small ingested radiopaque foreign bodies overlying the region of the gastric body and gastric antrum. Correlation with the patient's history and physical examination is recommended. Electronically Signed   By: Suzen Dials M.D.   On: 10/21/2024 17:35     Discharge Exam: Vitals:   10/24/24 0531 10/24/24 1340  BP: 119/85 (!) 123/95  Pulse: (!) 59 72  Resp: 16 18  Temp: 98.3 F (36.8 C) 98.7 F (37.1 C)  SpO2: 96% 97%   Vitals:   10/23/24 1823 10/23/24 2111 10/24/24 0531 10/24/24 1340  BP: (!) 127/90 117/85 119/85 (!) 123/95  Pulse: 64 (!) 49 (!) 59 72  Resp: 20 17 16 18   Temp: 98.4 F (36.9 C) 99 F (37.2 C) 98.3 F (36.8 C) 98.7 F (37.1 C)  TempSrc: Oral Oral Oral Oral  SpO2: 95% 92% 96% 97%  Weight:      Height:  General: Pt is alert, awake, not in acute distress Cardiovascular: RRR, S1/S2 +, no rubs, no  gallops Respiratory: CTA bilaterally, no wheezing, no rhonchi Abdominal: Soft, NT, ND, bowel sounds + Extremities: no edema, no cyanosis    The results of significant diagnostics from this hospitalization (including imaging, microbiology, ancillary and laboratory) are listed below for reference.     Microbiology: Recent Results (from the past 240 hours)  Resp panel by RT-PCR (RSV, Flu A&B, Covid) Anterior Nasal Swab     Status: None   Collection Time: 10/21/24  5:42 PM   Specimen: Anterior Nasal Swab  Result Value Ref Range Status   SARS Coronavirus 2 by RT PCR NEGATIVE NEGATIVE Final    Comment: (NOTE) SARS-CoV-2 target nucleic acids are NOT DETECTED.  The SARS-CoV-2 RNA is generally detectable in upper respiratory specimens during the acute phase of infection. The lowest concentration of SARS-CoV-2 viral copies this assay can detect is 138 copies/mL. A negative result does not preclude SARS-Cov-2 infection and should not be used as the sole basis for treatment or other patient management decisions. A negative result may occur with  improper specimen collection/handling, submission of specimen other than nasopharyngeal swab, presence of viral mutation(s) within the areas targeted by this assay, and inadequate number of viral copies(<138 copies/mL). A negative result must be combined with clinical observations, patient history, and epidemiological information. The expected result is Negative.  Fact Sheet for Patients:  bloggercourse.com  Fact Sheet for Healthcare Providers:  seriousbroker.it  This test is no t yet approved or cleared by the [place]  FDA and  has been authorized for detection and/or diagnosis of SARS-CoV-2 by FDA under an Emergency Use Authorization (EUA). This EUA will remain  in effect (meaning this test can be used) for the duration of the COVID-19 declaration under Section 564(b)(1) of the Act,  21 U.S.C.section 360bbb-3(b)(1), unless the authorization is terminated  or revoked sooner.       Influenza A by PCR NEGATIVE NEGATIVE Final   Influenza B by PCR NEGATIVE NEGATIVE Final    Comment: (NOTE) The Xpert Xpress SARS-CoV-2/FLU/RSV plus assay is intended as an aid in the diagnosis of influenza from Nasopharyngeal swab specimens and should not be used as a sole basis for treatment. Nasal washings and aspirates are unacceptable for Xpert Xpress SARS-CoV-2/FLU/RSV testing.  Fact Sheet for Patients: bloggercourse.com  Fact Sheet for Healthcare Providers: seriousbroker.it  This test is not yet approved or cleared by the [place]  FDA and has been authorized for detection and/or diagnosis of SARS-CoV-2 by FDA under an Emergency Use Authorization (EUA). This EUA will remain in effect (meaning this test can be used) for the duration of the COVID-19 declaration under Section 564(b)(1) of the Act, 21 U.S.C. section 360bbb-3(b)(1), unless the authorization is terminated or revoked.     Resp Syncytial Virus by PCR NEGATIVE NEGATIVE Final    Comment: (NOTE) Fact Sheet for Patients: bloggercourse.com  Fact Sheet for Healthcare Providers: seriousbroker.it  This test is not yet approved or cleared by the [place]  FDA and has been authorized for detection and/or diagnosis of SARS-CoV-2 by FDA under an Emergency Use Authorization (EUA). This EUA will remain in effect (meaning this test can be used) for the duration of the COVID-19 declaration under Section 564(b)(1) of the Act, 21 U.S.C. section 360bbb-3(b)(1), unless the authorization is terminated or revoked.  Performed at [facility], [address], [place], [place] [address]   MRSA Next Gen by PCR, Nasal  Status: None   Collection Time: 10/21/24  9:40 PM   Specimen: Nasal Mucosa; Nasal Swab  Result Value Ref  Range Status   MRSA by PCR Next Gen NOT DETECTED NOT DETECTED Final    Comment: (NOTE) The GeneXpert MRSA Assay (FDA approved for NASAL specimens only), is one component of a comprehensive MRSA colonization surveillance program. It is not intended to diagnose MRSA infection nor to guide or monitor treatment for MRSA infections. Test performance is not FDA approved in patients less than 48 years old. Performed at [facility], [address], [place], [place] [address]   Culture, blood (Routine X 2) w Reflex to ID Panel     Status: None (Preliminary result)   Collection Time: 10/22/24  1:41 PM   Specimen: BLOOD LEFT HAND  Result Value Ref Range Status   Specimen Description   Final    BLOOD LEFT HAND BOTTLES DRAWN AEROBIC AND ANAEROBIC   Special Requests   Final    Blood Culture results may not be optimal due to an inadequate volume of blood received in culture bottles   Culture   Final    NO GROWTH 2 DAYS Performed at [facility], [address] [place], [place] [address]    Report Status PENDING  Incomplete  Culture, blood (Routine X 2) w Reflex to ID Panel     Status: None (Preliminary result)   Collection Time: 10/22/24  1:41 PM   Specimen: BLOOD RIGHT HAND  Result Value Ref Range Status   Specimen Description   Final    BLOOD RIGHT HAND BOTTLES DRAWN AEROBIC AND ANAEROBIC   Special Requests   Final    Blood Culture results may not be optimal due to an inadequate volume of blood received in culture bottles   Culture   Final    NO GROWTH 2 DAYS Performed at  [facility], [address] [place], [place] [address]    Report Status PENDING  Incomplete     Labs: BNP (last 3 results) No results for input(s): BNP in the last 8760 hours. Basic Metabolic Panel: Recent Labs  Lab 10/21/24 1715 10/21/24 2112 10/22/24 0559 10/23/24 0358 10/24/24 0436  NA 141 141 142 142 142  K 3.3* 3.4* 3.8 3.6 3.3*  CL 104 106 107 109 108  CO2 8* 20* 19* 22 23  GLUCOSE 237* 105* 112* 95  125*  BUN 7 7 8 10 12   CREATININE 1.27* 0.93 1.21 1.02 0.98  CALCIUM 9.4 9.0 9.0 8.6* 8.4*  MG  --  2.5* 2.1 1.8 1.7  PHOS  --   --  2.3*  --   --    Liver Function Tests: Recent Labs  Lab 10/21/24 1715 10/22/24 0559 10/23/24 0358 10/24/24 0436  AST 60* 94* 93* 74*  ALT 89* 82* 68* 57*  ALKPHOS 77 61 49 50  BILITOT 0.5 0.8 0.7 0.5  PROT 7.0 6.2* 5.3* 5.2*  ALBUMIN 4.4 4.2 3.5 3.3*   Recent Labs  Lab 10/22/24 0559  LIPASE 11   No results for input(s): AMMONIA in the last 168 hours. CBC: Recent Labs  Lab 10/21/24 1715 10/22/24 0559 10/23/24 0358 10/24/24 0436  WBC 16.7* 16.1* 11.2* 9.5  NEUTROABS 12.7*  --   --   --   HGB 16.2 13.4 12.7* 12.7*  HCT 49.7 37.6* 37.0* 36.8*  MCV 97.1 90.6 91.6 90.6  PLT 310 205 180 160   Cardiac Enzymes: Recent Labs  Lab 10/21/24 2112 10/23/24 0358 10/24/24 0436  CKTOTAL 788* 1,564* 1,324*   BNP: Invalid input(s): POCBNP CBG: Recent Labs  Lab 10/21/24 1700 10/22/24 0043  GLUCAP 240* 117*   D-Dimer No results for input(s): DDIMER in the last 72 hours. Hgb A1c Recent Labs    10/21/24 2112  HGBA1C 5.2   Lipid Profile No results for input(s): CHOL, HDL, LDLCALC, TRIG, CHOLHDL, LDLDIRECT in the last 72 hours. Thyroid function studies No results for input(s): TSH, T4TOTAL, T3FREE, THYROIDAB in the last 72 hours.  Invalid input(s): FREET3 Anemia work up No results for input(s): VITAMINB12, FOLATE, FERRITIN, TIBC, IRON, RETICCTPCT in the last 72 hours. Urinalysis    Component Value Date/Time   COLORURINE STRAW (A) 10/21/2024 1711   APPEARANCEUR CLEAR 10/21/2024 1711   LABSPEC 1.010 10/21/2024 1711   PHURINE 5.0 10/21/2024 1711   GLUCOSEU >=500 (A) 10/21/2024 1711   HGBUR MODERATE (A) 10/21/2024 1711   BILIRUBINUR NEGATIVE 10/21/2024 1711   KETONESUR NEGATIVE 10/21/2024 1711   PROTEINUR 100 (A) 10/21/2024 1711   UROBILINOGEN 0.2 05/18/2012 0732   NITRITE NEGATIVE  10/21/2024 1711   LEUKOCYTESUR NEGATIVE 10/21/2024 1711   Sepsis Labs Recent Labs  Lab 10/21/24 1715 10/22/24 0559 10/23/24 0358 10/24/24 0436  WBC 16.7* 16.1* 11.2* 9.5   Microbiology Recent Results (from the past 240 hours)  Resp panel by RT-PCR (RSV, Flu A&B, Covid) Anterior Nasal Swab     Status: None   Collection Time: 10/21/24  5:42 PM   Specimen: Anterior Nasal Swab  Result Value Ref Range Status   SARS Coronavirus 2 by RT PCR NEGATIVE NEGATIVE Final    Comment: (NOTE) SARS-CoV-2 target nucleic acids are NOT DETECTED.  The SARS-CoV-2 RNA is generally detectable in upper respiratory specimens during the acute phase of infection. The lowest concentration of SARS-CoV-2 viral copies this assay can detect is 138 copies/mL. A negative result does not preclude SARS-Cov-2 infection and should not be used as the sole basis for treatment or other patient management decisions. A negative result may occur with  improper specimen collection/handling, submission of specimen other than nasopharyngeal swab, presence of viral mutation(s) within the areas targeted by this assay, and inadequate number of viral copies(<138 copies/mL). A negative result must be combined with clinical observations, patient history, and epidemiological information. The expected result is Negative.  Fact Sheet for Patients:  bloggercourse.com  Fact Sheet for Healthcare Providers:  seriousbroker.it  This test is no t yet approved or cleared by the [place]  FDA and  has been authorized for detection and/or diagnosis of SARS-CoV-2 by FDA under an Emergency Use Authorization (EUA). This EUA will remain  in effect (meaning this test can be used) for the duration of the COVID-19 declaration under Section 564(b)(1) of the Act, 21 U.S.C.section 360bbb-3(b)(1), unless the authorization is terminated  or revoked sooner.       Influenza A by PCR NEGATIVE  NEGATIVE Final   Influenza B by PCR NEGATIVE NEGATIVE Final    Comment: (NOTE) The Xpert Xpress SARS-CoV-2/FLU/RSV plus assay is intended as an aid in the diagnosis of influenza from Nasopharyngeal swab specimens and should not be used as a sole basis for treatment. Nasal washings and aspirates are unacceptable for Xpert Xpress SARS-CoV-2/FLU/RSV testing.  Fact Sheet for Patients: bloggercourse.com  Fact Sheet for Healthcare Providers: seriousbroker.it  This test is not yet approved or cleared by the [place]  FDA and has been authorized for detection and/or diagnosis of SARS-CoV-2 by FDA under an Emergency Use Authorization (EUA). This EUA will remain in effect (meaning  this test can be used) for the duration of the COVID-19 declaration under Section 564(b)(1) of the Act, 21 U.S.C. section 360bbb-3(b)(1), unless the authorization is terminated or revoked.     Resp Syncytial Virus by PCR NEGATIVE NEGATIVE Final    Comment: (NOTE) Fact Sheet for Patients: bloggercourse.com  Fact Sheet for Healthcare Providers: seriousbroker.it  This test is not yet approved or cleared by the [place]  FDA and has been authorized for detection and/or diagnosis of SARS-CoV-2 by FDA under an Emergency Use Authorization (EUA). This EUA will remain in effect (meaning this test can be used) for the duration of the COVID-19 declaration under Section 564(b)(1) of the Act, 21 U.S.C. section 360bbb-3(b)(1), unless the authorization is terminated or revoked.  Performed at [facility], [address], [place], [place] [address]   MRSA Next Gen by PCR, Nasal     Status: None   Collection Time: 10/21/24  9:40 PM   Specimen: Nasal Mucosa; Nasal Swab  Result Value Ref Range Status   MRSA by PCR Next Gen NOT DETECTED NOT DETECTED Final    Comment: (NOTE) The GeneXpert MRSA Assay (FDA approved for  NASAL specimens only), is one component of a comprehensive MRSA colonization surveillance program. It is not intended to diagnose MRSA infection nor to guide or monitor treatment for MRSA infections. Test performance is not FDA approved in patients less than 3 years old. Performed at [facility], [address] [place], [place] [address]   Culture, blood (Routine X 2) w Reflex to ID Panel     Status: None (Preliminary result)   Collection Time: 10/22/24  1:41 PM   Specimen: BLOOD LEFT HAND  Result Value Ref Range Status   Specimen Description   Final    BLOOD LEFT HAND BOTTLES DRAWN AEROBIC AND ANAEROBIC   Special Requests   Final    Blood Culture results may not be optimal due to an inadequate volume of blood received in culture bottles   Culture   Final    NO GROWTH 2 DAYS Performed at [facility], [address] [place], [place] [address]    Report Status PENDING  Incomplete  Culture, blood (Routine X 2) w Reflex to ID Panel     Status: None (Preliminary result)   Collection Time: 10/22/24  1:41 PM   Specimen: BLOOD RIGHT HAND  Result Value Ref Range Status   Specimen Description   Final    BLOOD RIGHT HAND BOTTLES DRAWN AEROBIC AND ANAEROBIC   Special Requests   Final    Blood Culture results may not be optimal due to an inadequate volume of blood received in culture bottles   Culture   Final    NO GROWTH 2 DAYS Performed at [facility], [address], [place], [place] [address]    Report Status PENDING  Incomplete     Time coordinating discharge: 35 minutes  SIGNED:   Adron JONETTA Fairly, DO [facility] 10/24/2024, 4:31 PM  If 7PM-7AM, please contact night-coverage [email or web address]     [1]  Allergies Allergen Reactions   Penicillins Other (See Comments)    Unknown Childhood reaction   Prozac [Fluoxetine Hcl] Other (See Comments)    made me crazy   Wellbutrin [Bupropion]

## 2024-10-24 NOTE — Consult Note (Signed)
 I connected with  Javier Johnston on 10/24/24 by a video enabled telemedicine application and verified that I am speaking with the correct person using two identifiers.   I discussed the limitations of evaluation and management by telemedicine. The patient expressed understanding and agreed to proceed.  Location of patient: [facility] Location of physician: [facility]   Neurology Consultation Reason for Consult: Seizure Referring Physician: Dr. Adron Fairly  CC: Seizure  History is obtained from: Patient, chart review  HPI: Javier Johnston is a 34 y.o. male with no significant past medical history who was brought in after an episode of seizure-like activity.  Patient states he does not remember the episode.  However per chart review patient was recently sick with the flu.  On 10/22/2023 he went to take the dogs out for a walk but fell down and had seizure-like activity, not associated with tongue bite or incontinence.  Patient states he has only had seizure once before maybe about a decade ago but at that time used to abuse prescription pills including pain meds.  Does report patient's father has epilepsy and is well-controlled on Keppra .  Epilepsy risk factors: Reports birth at term, denies febrile seizures, meningitis/encephalitis, head injury.  Father has epilepsy.   ROS: All other systems reviewed and negative except as noted in the HPI.   Past Medical History:  Diagnosis Date   Anxiety    Depression    Hypertension    Narcotic dependence, in remission [facility])    were treated ibnpatent Dec-Jan 2013   Seizure (HCC)    had 1 seizure approx 2 months ago.    Family History  Problem Relation Age of Onset   Coronary artery disease Father 52    Social History:  reports that he quit smoking about 4 years ago. His smoking use included cigarettes. He started smoking about 8 years ago. He has a 2.2 pack-year smoking history. He has quit using smokeless tobacco. He  reports that he does not currently use alcohol. He reports that he does not currently use drugs after having used the following drugs: Marijuana.   Medications Prior to Admission  Medication Sig Dispense Refill Last Dose/Taking   Buprenorphine  HCl-Naloxone  HCl 8-2 MG FILM Place 1 Film under the tongue 2 (two) times daily.   10/21/2024   ibuprofen  (ADVIL ) 800 MG tablet Take 1 tablet (800 mg total) by mouth every 8 (eight) hours as needed. Take with food to prevent GI upset 30 tablet 0 Unknown      Exam: Current vital signs: BP 119/85 (BP Location: Left Arm)   Pulse (!) 59   Temp 98.3 F (36.8 C) (Oral)   Resp 16   Ht 5' 6 (1.676 m)   Wt 50.8 kg   SpO2 96%   BMI 18.08 kg/m  Vital signs in last 24 hours: Temp:  [98.3 F (36.8 C)-99 F (37.2 C)] 98.3 F (36.8 C) (01/19 0531) Pulse Rate:  [49-69] 59 (01/19 0531) Resp:  [16-20] 16 (01/19 0531) BP: (115-127)/(85-90) 119/85 (01/19 0531) SpO2:  [92 %-96 %] 96 % (01/19 0531)   Physical Exam  Constitutional: Appears well-developed and well-nourished.  Psych: Affect appropriate to situation Neuro: AO x 3, cranial nerves grossly intact, antigravity in all extremities without drift, sensory intact to light touch, FTN intact bilaterally  I have reviewed labs in epic and the results pertinent to this consultation are: CBC:  Recent Labs  Lab 10/21/24 1715 10/22/24 0559 10/23/24 0358 10/24/24 0436  WBC 16.7*   < > 11.2* 9.5  NEUTROABS 12.7*  --   --   --   HGB 16.2   < > 12.7* 12.7*  HCT 49.7   < > 37.0* 36.8*  MCV 97.1   < > 91.6 90.6  PLT 310   < > 180 160   < > = values in this interval not displayed.    Basic Metabolic Panel:  Lab Results  Component Value Date   NA 142 10/24/2024   K 3.3 (L) 10/24/2024   CO2 23 10/24/2024   GLUCOSE 125 (H) 10/24/2024   BUN 12 10/24/2024   CREATININE 0.98 10/24/2024   CALCIUM 8.4 (L) 10/24/2024   GFRNONAA >60 10/24/2024   GFRAA >90 09/09/2012   Lipid Panel: No results found for:  LDLCALC HgbA1c:  Lab Results  Component Value Date   HGBA1C 5.2 10/21/2024   Urine Drug Screen:     Component Value Date/Time   LABOPIA NEGATIVE 10/21/2024 1711   COCAINSCRNUR NEGATIVE 10/21/2024 1711   LABBENZ NEGATIVE 10/21/2024 1711   AMPHETMU NEGATIVE 10/21/2024 1711   THCU POSITIVE (A) 10/21/2024 1711   LABBARB NEGATIVE 10/21/2024 1711    Alcohol Level     Component Value Date/Time   ETH <15 10/21/2024 1715     I have reviewed the images obtained:  MRI brain without contrast 10/12/2024: No acute abnormality   ASSESSMENT/PLAN: 34 year old male who presented with new onset seizure.  Reportedly had respiratory infection for 1 week.  No other clear provoking factors.  Seizure - Typically after first-time seizure, as long as EEG and MRI within normal limits, I do not recommend starting antiseizure medications - However patient had leukocytosis, elevated CK and lactic acid.  Also has family history of epilepsy.  Therefore I discussed starting antiseizure medications versus holding off and patient would prefer to be on antiseizure medications - Therefore recommend continue Keppra  500 mg twice daily for now -Rescue medication: Recommend clonazepam  2 mg (2 tablets only) for seizure lasting more than 2 minutes - EEG ordered and pending - I discussed seizure precautions including no driving for 6 months - Follow-up with neurology 4 weeks (order placed) - Discussed plan with Dr. Maree via secure chat - Management of rhabdomyolysis per primary team  Seizure precautions: Per [place]  DMV statutes, patients with seizures are not allowed to drive until they have been seizure-free for six months and cleared by a physician    Use caution when using heavy equipment or power tools. Avoid working on ladders or at heights. Take showers instead of baths. Ensure the water temperature is not too high on the home water heater. Do not go swimming alone. Do not lock yourself in a room alone  (i.e. bathroom). When caring for infants or small children, sit down when holding, feeding, or changing them to minimize risk of injury to the child in the event you have a seizure. Maintain good sleep hygiene. Avoid alcohol.    If patient has another seizure, call 911 and bring them back to the ED if: A.  The seizure lasts longer than 5 minutes.      B.  The patient doesn't wake shortly after the seizure or has new problems such as difficulty seeing, speaking or moving following the seizure C.  The patient was injured during the seizure D.  The patient has a temperature over 102 F (39C) E.  The patient vomited during the seizure and now is having trouble breathing    During  the Seizure   - First, ensure adequate ventilation and place patients on the floor on their left side  Loosen clothing around the neck and ensure the airway is patent. If the patient is clenching the teeth, do not force the mouth open with any object as this can cause severe damage - Remove all items from the surrounding that can be hazardous. The patient may be oblivious to what's happening and may not even know what he or she is doing. If the patient is confused and wandering, either gently guide him/her away and block access to outside areas - Reassure the individual and be comforting - Call 911. In most cases, the seizure ends before EMS arrives. However, there are cases when seizures may last over 3 to 5 minutes. Or the individual may have developed breathing difficulties or severe injuries. If a pregnant patient or a person with diabetes develops a seizure, it is prudent to call an ambulance.     After the Seizure (Postictal Stage)   After a seizure, most patients experience confusion, fatigue, muscle pain and/or a headache. Thus, one should permit the individual to sleep. For the next few days, reassurance is essential. Being calm and helping reorient the person is also of importance.   Most seizures are painless  and end spontaneously. Seizures are not harmful to others but can lead to complications such as stress on the lungs, brain and the heart. Individuals with prior lung problems may develop labored breathing and respiratory distress.    Thank you for allowing us  to participate in the care of this patient. If you have any further questions, please contact  me or neurohospitalist.     I personally spent a total of 82 minutes in the care of the patient today including getting/reviewing separately obtained history, performing a medically appropriate exam/evaluation, counseling and educating, placing orders, referring and communicating with other health care professionals, documenting clinical information in the EHR, independently interpreting results, and coordinating care.         Arlin Krebs Epilepsy [facility]

## 2024-10-24 NOTE — Progress Notes (Addendum)
 EEG complete - results pending

## 2024-10-24 NOTE — Procedures (Signed)
 Patient Name: Javier Johnston  MRN: [id number]  Epilepsy Attending: Arlin MALVA Krebs  Referring Physician/Provider: Adefeso, Oladapo, DO Date: 10/24/2024 Duration: 24.17 mins  Patient history: 34 year old male who presented with new onset seizure. EEG to evaluate for seizure  Level of alertness: Awake  AEDs during EEG study: LEV  Technical aspects: This EEG study was done with scalp electrodes positioned according to the 10-20 International system of electrode placement. Electrical activity was reviewed with band pass filter of 1-70Hz , sensitivity of 7 uV/mm, display speed of 71mm/sec with a 60Hz  notched filter applied as appropriate. EEG data were recorded continuously and digitally stored.  Video monitoring was available and reviewed as appropriate.  Description: The posterior dominant rhythm consists of 9-10 Hz activity of moderate voltage (25-35 uV) seen predominantly in posterior head regions, symmetric and reactive to eye opening and eye closing. Hyperventilation did not show any EEG change.  Physiologic photic driving was seen during photic stimulation.    IMPRESSION: This study is within normal limits. No seizures or epileptiform discharges were seen throughout the recording.  A normal interictal EEG does not exclude the diagnosis of epilepsy.  Tobias Avitabile O Lalana Wachter

## 2024-10-24 NOTE — Plan of Care (Signed)

## 2024-10-24 NOTE — Progress Notes (Signed)
 Patient placed in OBSERVATION for New onset Seizures, Inpatient Care Manager [facility]) completed brief admission assessment.  No immediate discharge needs identified at this time. However, IPCM team will continue to follow along and monitor patient advancement through interdisciplinary progression rounds. If transition of care needs arise, please enter a ICM consult to prompt IPCM team to follow up.    10/24/24 1125  TOC Brief Assessment  Insurance and Status Reviewed  Patient has primary care physician Yes  Home environment has been reviewed Home with Spouse  Prior level of function: Independent  Prior/Current Home Services No current home services  Social Drivers of Health Review SDOH reviewed no interventions necessary  Readmission risk has been reviewed Yes  Transition of care needs no transition of care needs at this time

## 2024-10-26 ENCOUNTER — Encounter (HOSPITAL_BASED_OUTPATIENT_CLINIC_OR_DEPARTMENT_OTHER): Payer: Self-pay

## 2024-10-26 ENCOUNTER — Ambulatory Visit (INDEPENDENT_AMBULATORY_CARE_PROVIDER_SITE_OTHER)

## 2024-10-26 VITALS — BP 132/88 | HR 63 | Ht 66.0 in | Wt 114.8 lb

## 2024-10-26 DIAGNOSIS — F32A Depression, unspecified: Secondary | ICD-10-CM | POA: Diagnosis not present

## 2024-10-26 DIAGNOSIS — R569 Unspecified convulsions: Secondary | ICD-10-CM | POA: Diagnosis not present

## 2024-10-26 DIAGNOSIS — F419 Anxiety disorder, unspecified: Secondary | ICD-10-CM

## 2024-10-26 DIAGNOSIS — Z7689 Persons encountering health services in other specified circumstances: Secondary | ICD-10-CM

## 2024-10-26 MED ORDER — BUSPIRONE HCL 5 MG PO TABS: 5.0000 mg | ORAL_TABLET | Freq: Two times a day (BID) | ORAL | 2 refills | Status: AC

## 2024-10-26 NOTE — Progress Notes (Signed)
 "   New Patient Office Visit  Subjective:   Javier Johnston Sep 11, 1991 10/26/2024  Chief Complaint  Patient presents with   New Patient (Initial Visit)    Patient is here today to get established with the practice. Was recently in the hospital after having a seizure. States he feels each day is getting better and states he is getting his strength back. Pt's spouse wants to discuss medications.    HPI: Javier Johnston presents today to establish care at Primary Care and Sports Medicine at [facility]. Introduced to publishing rights manager role and practice setting with verbalized understanding by patient.  All questions answered.   Last PCP: 2019 Last annual physical: 2019 Concerns: See below   Hospital Follow Up for Seizures:  States he has overall been doing well. States he feels this has caused an increase in his anxiety just of fear that his seizure will happen again. Per wife, patient is sleeping well and eating. States he looks much better and has improved strength. Unsure when neurology follow up is as they have not called to schedule his appointment.  Anxiety: Has always struggled with anxiety but has noticed a significant increase in anxiety since death of his grandfather and recent seizure. States he has never been on medication but believes that would be helpful in the setting of his current issues. States he was self medication with THC but feels that could have been the cause of his most recent seizure.   The following portions of the patient's history were reviewed and updated as appropriate: past medical history, past surgical history, family history, social history, allergies, medications, and problem list.   Patient Active Problem List   Diagnosis Date Noted   Seizure (HCC) 10/22/2024   Seizures (HCC) 10/21/2024   Pyelonephritis 05/19/2012   Acute renal failure 05/18/2012   Nausea and vomiting 05/18/2012   Anxiety and depression 05/18/2012    Hyperglycemia 05/18/2012   Past Medical History:  Diagnosis Date   Anxiety    Depression    Hypertension    Narcotic dependence, in remission [facility])    were treated ibnpatent Dec-Jan 2013   Seizure (HCC)    had 1 seizure approx 2 months ago.   Past Surgical History:  Procedure Laterality Date   APPENDECTOMY     27   Family History  Problem Relation Age of Onset   Coronary artery disease Father 10   Social History   Socioeconomic History   Marital status: Single    Spouse name: Not on file   Number of children: Not on file   Years of education: Not on file   Highest education level: Not on file  Occupational History   Occupation: dance movement psychotherapist    Employer: curator  Tobacco Use   Smoking status: Former    Current packs/day: 0.00    Average packs/day: 0.5 packs/day for 4.5 years (2.2 ttl pk-yrs)    Types: Cigarettes    Start date: 01/06/2016    Quit date: 07/06/2020    Years since quitting: 4.3   Smokeless tobacco: Former  Building Services Engineer status: Every Day  Substance and Sexual Activity   Alcohol use: Not Currently    Comment: occ   Drug use: Not Currently    Types: Marijuana   Sexual activity: Not on file  Other Topics Concern   Not on file  Social History Narrative   Not on file   Social Drivers of Health   Tobacco  Use: Medium Risk (10/26/2024)   Patient History    Smoking Tobacco Use: Former    Smokeless Tobacco Use: Former    Passive Exposure: Not on Actuary Strain: Not on file  Food Insecurity: Patient Declined (10/21/2024)   Epic    Worried About Programme Researcher, Broadcasting/film/video in the Last Year: Patient declined    Barista in the Last Year: Patient declined  Transportation Needs: Patient Declined (10/21/2024)   Epic    Lack of Transportation (Medical): Patient declined    Lack of Transportation (Non-Medical): Patient declined  Physical Activity: Not on file  Stress: Not on file  Social Connections: Not on file   Intimate Partner Violence: Patient Declined (10/21/2024)   Epic    Fear of Current or Ex-Partner: Patient declined    Emotionally Abused: Patient declined    Physically Abused: Patient declined    Sexually Abused: Patient declined  Depression (PHQ2-9): Low Risk (10/26/2024)   Depression (PHQ2-9)    PHQ-2 Score: 2  Alcohol Screen: Not on file  Housing: Unknown (10/21/2024)   Epic    Unable to Pay for Housing in the Last Year: Patient declined    Number of Times Moved in the Last Year: 0    Homeless in the Last Year: Patient declined  Utilities: Patient Declined (10/21/2024)   Epic    Threatened with loss of utilities: Patient declined  Health Literacy: Not on file   Outpatient Medications Prior to Visit  Medication Sig Dispense Refill   Buprenorphine  HCl-Naloxone  HCl 8-2 MG FILM Place 1 Film under the tongue 2 (two) times daily.     clonazePAM  (KLONOPIN ) 2 MG tablet Take 1 tablet (2 mg total) by mouth as needed (seizure). 2 tablet 0   gabapentin  (NEURONTIN ) 100 MG capsule Take 1 capsule (100 mg total) by mouth 3 (three) times daily. 90 capsule 0   levETIRAcetam  (KEPPRA ) 500 MG tablet Take 1 tablet (500 mg total) by mouth 2 (two) times daily. 60 tablet 2   meloxicam  (MOBIC ) 7.5 MG tablet Take 1 tablet (7.5 mg total) by mouth daily. 14 tablet 0   No facility-administered medications prior to visit.   Allergies[1]  ROS: A complete ROS was performed with pertinent positives/negatives noted in the HPI. The remainder of the ROS are negative.   Objective:   Today's Vitals   10/26/24 1016 10/26/24 1107  BP: (!) 134/101 132/88  Pulse: 63   SpO2: 97%   Weight: 114 lb 12.8 oz (52.1 kg)   Height: 5' 6 (1.676 m)     GENERAL: Well-appearing, in NAD. Well nourished. SKIN: noted healing bruises from recent hospital admission. No drainage, erythema, or edema RESPIRATORY: Chest wall symmetrical. Respirations even and non-labored. Breath sounds clear to auscultation bilaterally.  CARDIAC:  S1, S2 present, regular rate and rhythm without murmur or gallops. Peripheral pulses 2+ bilaterally.  MSK: Muscle tone and strength appropriate for age. Joints w/o tenderness, redness, or swelling.  EXTREMITIES: Without clubbing, cyanosis, or edema.  NEUROLOGIC: No motor or sensory deficits. Steady, even gait. C2-C12 intact.  PSYCH/MENTAL STATUS: Alert, oriented x 3. Cooperative, appropriate mood and affect.    Health Maintenance Due  Topic Date Due   Hepatitis C Screening  Never done   DTaP/Tdap/Td (1 - Tdap) Never done   Hepatitis B Vaccines 19-59 Average Risk (1 of 3 - 19+ 3-dose series) Never done    No results found for any visits on 10/26/24.     Assessment & Plan:  1. Encounter to establish care with new doctor (Primary) Discussed role of NP and expectations of the Primary Care Clinic. Discussed medical, surgical, and family history.   2. Seizures (HCC) Discussed close follow up with neurology. Current regimen Keppra  500mg  BID which patient is taking without any issues. Discussed messaging PCP if neurology appointment extends further than amount of medication originally prescribed after hospital discharge. Discussed cessation of THC products as this can lower his seizure threshold. Discussed when to be seen in ER or by PCP.   3. Anxiety and depression Medication ordered today to help patient's increased/worsening anxiety. Patient denies SI/HI. Discussed proper use of medication as well as potential side effects. Pt to closely follow with PCP if medication is helpful for his anxiety.  - busPIRone  (BUSPAR ) 5 MG tablet; Take 1 tablet (5 mg total) by mouth 2 (two) times daily.  Dispense: 60 tablet; Refill: 2    Patient to reach out to office if new, worrisome, or unresolved symptoms arise or if no improvement in patient's condition. Patient verbalized understanding and is agreeable to treatment plan. All questions answered to patient's satisfaction.    Return in about 4 weeks  (around 11/23/2024) for annual physical (fasting labs day of).    Lauraine Almarie Angus DNP, FNP-C     [1]  Allergies Allergen Reactions   Penicillins Other (See Comments)    Unknown Childhood reaction   Prozac [Fluoxetine Hcl] Other (See Comments)    made me crazy   Wellbutrin [Bupropion]    "

## 2024-10-27 LAB — CULTURE, BLOOD (ROUTINE X 2)
Culture: NO GROWTH
Culture: NO GROWTH

## 2024-11-01 ENCOUNTER — Ambulatory Visit: Admitting: Diagnostic Neuroimaging

## 2024-11-23 ENCOUNTER — Encounter (HOSPITAL_BASED_OUTPATIENT_CLINIC_OR_DEPARTMENT_OTHER)

## 2024-12-19 ENCOUNTER — Ambulatory Visit: Admitting: Diagnostic Neuroimaging
